# Patient Record
Sex: Female | Born: 1935 | Race: White | Hispanic: No | Smoking: Never smoker
Health system: Southern US, Community
[De-identification: ages and names within clinical notes are randomized; demographics above are authoritative.]

## PROBLEM LIST (undated history)

## (undated) DIAGNOSIS — K219 Gastro-esophageal reflux disease without esophagitis: Secondary | ICD-10-CM

## (undated) DIAGNOSIS — R600 Localized edema: Secondary | ICD-10-CM

## (undated) DIAGNOSIS — M1990 Unspecified osteoarthritis, unspecified site: Secondary | ICD-10-CM

## (undated) DIAGNOSIS — N3289 Other specified disorders of bladder: Secondary | ICD-10-CM

## (undated) DIAGNOSIS — I10 Essential (primary) hypertension: Secondary | ICD-10-CM

## (undated) DIAGNOSIS — R1013 Epigastric pain: Secondary | ICD-10-CM

## (undated) DIAGNOSIS — R609 Edema, unspecified: Secondary | ICD-10-CM

## (undated) DIAGNOSIS — K529 Noninfective gastroenteritis and colitis, unspecified: Secondary | ICD-10-CM

## (undated) DIAGNOSIS — J449 Chronic obstructive pulmonary disease, unspecified: Secondary | ICD-10-CM

## (undated) DIAGNOSIS — I739 Peripheral vascular disease, unspecified: Secondary | ICD-10-CM

## (undated) DIAGNOSIS — H3530 Unspecified macular degeneration: Secondary | ICD-10-CM

## (undated) DIAGNOSIS — K602 Anal fissure, unspecified: Secondary | ICD-10-CM

## (undated) HISTORY — PX: OTHER SURGICAL HISTORY: SHX169

## (undated) HISTORY — DX: Epigastric pain: R10.13

## (undated) HISTORY — DX: Essential (primary) hypertension: I10

## (undated) HISTORY — DX: Other specified disorders of bladder: N32.89

## (undated) HISTORY — DX: Gastro-esophageal reflux disease without esophagitis: K21.9

## (undated) HISTORY — DX: Noninfective gastroenteritis and colitis, unspecified: K52.9

## (undated) HISTORY — DX: Unspecified macular degeneration: H35.30

## (undated) HISTORY — DX: Unspecified osteoarthritis, unspecified site: M19.90

## (undated) HISTORY — DX: Chronic obstructive pulmonary disease, unspecified: J44.9

## (undated) HISTORY — DX: Anal fissure, unspecified: K60.2

## (undated) HISTORY — DX: Edema, unspecified: R60.9

## (undated) HISTORY — DX: Peripheral vascular disease, unspecified: I73.9

## (undated) HISTORY — DX: Localized edema: R60.0

## (undated) HISTORY — PX: APPENDECTOMY: SHX54

---

## 1999-07-16 ENCOUNTER — Observation Stay (HOSPITAL_COMMUNITY): Admission: RE | Admit: 1999-07-16 | Discharge: 1999-07-17 | Payer: Self-pay | Admitting: *Deleted

## 1999-07-16 ENCOUNTER — Encounter: Payer: Self-pay | Admitting: *Deleted

## 2000-06-30 ENCOUNTER — Ambulatory Visit (HOSPITAL_COMMUNITY): Admission: RE | Admit: 2000-06-30 | Discharge: 2000-07-01 | Payer: Self-pay | Admitting: *Deleted

## 2000-07-18 ENCOUNTER — Encounter: Payer: Self-pay | Admitting: Gastroenterology

## 2000-07-18 ENCOUNTER — Ambulatory Visit (HOSPITAL_COMMUNITY): Admission: RE | Admit: 2000-07-18 | Discharge: 2000-07-18 | Payer: Self-pay | Admitting: Gastroenterology

## 2000-07-18 ENCOUNTER — Encounter (INDEPENDENT_AMBULATORY_CARE_PROVIDER_SITE_OTHER): Payer: Self-pay | Admitting: Specialist

## 2000-08-11 ENCOUNTER — Ambulatory Visit (HOSPITAL_COMMUNITY): Admission: RE | Admit: 2000-08-11 | Discharge: 2000-08-11 | Payer: Self-pay | Admitting: Gastroenterology

## 2000-08-11 ENCOUNTER — Encounter: Payer: Self-pay | Admitting: Gastroenterology

## 2004-06-14 ENCOUNTER — Ambulatory Visit: Payer: Self-pay | Admitting: Internal Medicine

## 2004-06-15 ENCOUNTER — Ambulatory Visit: Payer: Self-pay | Admitting: Internal Medicine

## 2007-03-13 DIAGNOSIS — K5289 Other specified noninfective gastroenteritis and colitis: Secondary | ICD-10-CM

## 2007-03-13 DIAGNOSIS — R1013 Epigastric pain: Secondary | ICD-10-CM

## 2007-03-13 DIAGNOSIS — M1990 Unspecified osteoarthritis, unspecified site: Secondary | ICD-10-CM | POA: Insufficient documentation

## 2007-03-13 DIAGNOSIS — Z8719 Personal history of other diseases of the digestive system: Secondary | ICD-10-CM

## 2007-03-13 DIAGNOSIS — K3189 Other diseases of stomach and duodenum: Secondary | ICD-10-CM

## 2007-03-13 DIAGNOSIS — I739 Peripheral vascular disease, unspecified: Secondary | ICD-10-CM

## 2007-03-13 DIAGNOSIS — K219 Gastro-esophageal reflux disease without esophagitis: Secondary | ICD-10-CM | POA: Insufficient documentation

## 2007-03-13 DIAGNOSIS — I10 Essential (primary) hypertension: Secondary | ICD-10-CM | POA: Insufficient documentation

## 2007-03-13 DIAGNOSIS — J449 Chronic obstructive pulmonary disease, unspecified: Secondary | ICD-10-CM | POA: Insufficient documentation

## 2007-03-13 DIAGNOSIS — K2980 Duodenitis without bleeding: Secondary | ICD-10-CM | POA: Insufficient documentation

## 2007-03-13 DIAGNOSIS — J4489 Other specified chronic obstructive pulmonary disease: Secondary | ICD-10-CM | POA: Insufficient documentation

## 2007-03-13 DIAGNOSIS — K602 Anal fissure, unspecified: Secondary | ICD-10-CM

## 2007-03-14 ENCOUNTER — Encounter: Payer: Self-pay | Admitting: Internal Medicine

## 2007-03-14 ENCOUNTER — Ambulatory Visit: Payer: Self-pay | Admitting: Internal Medicine

## 2007-04-11 ENCOUNTER — Telehealth: Payer: Self-pay | Admitting: Internal Medicine

## 2007-04-12 LAB — CONVERTED CEMR LAB
BUN: 17 mg/dL (ref 6–23)
CO2: 34 meq/L — ABNORMAL HIGH (ref 19–32)
Calcium: 9.7 mg/dL (ref 8.4–10.5)
Chloride: 105 meq/L (ref 96–112)
Cholesterol: 179 mg/dL (ref 0–200)
Creatinine, Ser: 0.9 mg/dL (ref 0.4–1.2)
Direct LDL: 91 mg/dL
GFR calc Af Amer: 75 mL/min
GFR calc non Af Amer: 62 mL/min
Glucose, Bld: 114 mg/dL — ABNORMAL HIGH (ref 70–99)
HDL: 36.2 mg/dL — ABNORMAL LOW (ref >39.0)
Potassium: 4 meq/L (ref 3.5–5.1)
Sodium: 144 meq/L (ref 135–145)
Total CHOL/HDL Ratio: 4.9
Triglycerides: 337 mg/dL (ref 0–149)
VLDL: 67 mg/dL — ABNORMAL HIGH (ref 0–40)

## 2007-05-03 ENCOUNTER — Ambulatory Visit: Payer: Self-pay | Admitting: Internal Medicine

## 2007-05-03 DIAGNOSIS — N3289 Other specified disorders of bladder: Secondary | ICD-10-CM

## 2007-09-17 ENCOUNTER — Telehealth: Payer: Self-pay | Admitting: Internal Medicine

## 2008-02-14 ENCOUNTER — Ambulatory Visit: Payer: Self-pay | Admitting: Internal Medicine

## 2008-02-14 DIAGNOSIS — R609 Edema, unspecified: Secondary | ICD-10-CM | POA: Insufficient documentation

## 2008-10-06 ENCOUNTER — Telehealth: Payer: Self-pay | Admitting: Internal Medicine

## 2009-02-17 ENCOUNTER — Ambulatory Visit: Payer: Self-pay | Admitting: Internal Medicine

## 2009-02-17 DIAGNOSIS — H3530 Unspecified macular degeneration: Secondary | ICD-10-CM | POA: Insufficient documentation

## 2009-03-16 ENCOUNTER — Ambulatory Visit: Payer: Self-pay | Admitting: Cardiovascular Disease

## 2009-03-19 ENCOUNTER — Encounter: Payer: Self-pay | Admitting: Cardiovascular Disease

## 2009-03-19 ENCOUNTER — Ambulatory Visit: Payer: Self-pay

## 2009-04-22 ENCOUNTER — Ambulatory Visit: Payer: Self-pay | Admitting: Cardiovascular Disease

## 2009-05-08 ENCOUNTER — Telehealth: Payer: Self-pay | Admitting: Internal Medicine

## 2009-09-10 ENCOUNTER — Encounter (INDEPENDENT_AMBULATORY_CARE_PROVIDER_SITE_OTHER): Payer: Self-pay | Admitting: *Deleted

## 2009-11-06 ENCOUNTER — Encounter: Payer: Self-pay | Admitting: Cardiovascular Disease

## 2009-11-09 ENCOUNTER — Ambulatory Visit: Payer: Self-pay | Admitting: Cardiovascular Disease

## 2009-11-25 ENCOUNTER — Ambulatory Visit: Payer: Self-pay | Admitting: Internal Medicine

## 2009-11-25 ENCOUNTER — Observation Stay (HOSPITAL_COMMUNITY): Admission: AD | Admit: 2009-11-25 | Discharge: 2009-11-28 | Payer: Self-pay | Admitting: Internal Medicine

## 2009-11-26 ENCOUNTER — Ambulatory Visit: Payer: Self-pay | Admitting: Internal Medicine

## 2009-11-26 ENCOUNTER — Telehealth: Payer: Self-pay | Admitting: Internal Medicine

## 2009-12-01 ENCOUNTER — Telehealth: Payer: Self-pay | Admitting: Internal Medicine

## 2009-12-11 ENCOUNTER — Ambulatory Visit: Payer: Self-pay | Admitting: Internal Medicine

## 2010-02-16 ENCOUNTER — Ambulatory Visit: Payer: Self-pay | Admitting: Internal Medicine

## 2010-06-29 NOTE — Initial Assessments (Signed)
Summary: RECTAL BLEEDING AND UPPER ABD PAIN/NWS   Vital Signs:  Patient profile:   77 year old female Height:      60 inches O2 Sat:      96 % on Room air Temp:     97.5 degrees F oral Pulse rate:   49 / minute BP sitting:   126 / 60  (left arm) Cuff size:   regular  Vitals Entered By: Bill Salinas CMA (November 25, 2009 11:41 AM)  O2 Flow:  Room air CC: pt her with c/o blood in stools/ ab   Primary Care Provider:  Norins  CC:  pt her with c/o blood in stools/ ab.  History of Present Illness: Patient presents acutely for bloody stools. She reports that since last night she has had 12 bloody stools. When she cleans herself there is frank blood on the tissue and her son there is frank blood in the commode. She has also developed substernal chest pain. Today she fell in the parking lot due to leg weakness. She is now admitted for 24 hr eval for probable diverticular bleed and possible anemia related angina.  Current Medications (verified): 1)  Atenolol 50 Mg Tabs (Atenolol) .... Take 1 Tablet By Mouth Once A Day 2)  Furosemide 40 Mg Tabs (Furosemide) .... Take 1 Tablet By Mouth Once A Day 3)  Norvasc 10 Mg  Tabs (Amlodipine Besylate) .Marland Kitchen.. 1 Tab Once Daily 4)  Xanax 0.5 Mg  Tabs (Alprazolam) .Marland Kitchen.. 1 As Needed 5)  Tylenol Arthritis Pain 650 Mg  Tbcr (Acetaminophen) .... As Needed 6)  Oxybutynin Chloride 5 Mg  Tabs (Oxybutynin Chloride) .... Take 1 Tablet By Mouth Once A Day 7)  Aspirin Ec Low Dose 81 Mg  Tbec (Aspirin) .... Take 1 Tablet By Mouth Once A Day 8)  Calcium 600 1500 Mg Tabs (Calcium Carbonate) .... Take 1 Tablet By Mouth Once A Day 9)  Plavix 75 Mg Tabs (Clopidogrel Bisulfate) .... Take One Tablet By Mouth Daily  Allergies (verified): No Known Drug Allergies  Past History:  Past Medical History: Last updated: 03/04/2009 PERIPHERAL VASCULAR DISEASE (ICD-443.9) PERIPHERAL EDEMA (ICD-782.3) COPD (ICD-496) HYPERTENSION (ICD-401.9) MACULAR DEGENERATION, BILATERAL  (ICD-362.50) IRRITABLE BLADDER (ICD-596.8) Hx of RECTAL FISSURE (ICD-565.0) * MOLE EXCISION * CATARACT SURGERY * LAPAROTOMY FOR TREATMENT OF ADHESIONS * LAPAROTOMY FOR RUPTURED APPENDIX Hx of ENTERITIS (ICD-558.9) DUODENITIS (ICD-535.60) ESOPHAGITIS, HX OF (ICD-V12.79) DEGENERATIVE JOINT DISEASE (ICD-715.90) DYSPEPSIA (ICD-536.8) GERD (ICD-530.81)    Past Surgical History: Last updated: 03/13/2007 Hx of RECTAL FISSURE (ICD-565.0) * MOLE EXCISION * CATARACT SURGERY * LAPAROTOMY FOR TREATMENT OF ADHESIONS * LAPAROTOMY FOR RUPTURED APPENDIX  Family History: Last updated: 03/14/2007 osteoporsis macular degeneration  Social History: Last updated: 03/16/2009 lives with her son. I-ADLs Widow x 43 years. No tobacco, alcohol, drugs. She drinks daily coffee Pt is DNR  Review of Systems       The patient complains of vision loss, chest pain, hematochezia, muscle weakness, and abnormal bleeding.  The patient denies anorexia, fever, weight loss, weight gain, decreased hearing, syncope, dyspnea on exertion, peripheral edema, prolonged cough, headaches, hemoptysis, abdominal pain, melena, incontinence, and difficulty walking.         macular degeneration with 95% visual loss  Physical Exam  General:  Very elderly (89) white female in no acute distress Head:  normocephalic and atraumatic.   Eyes:  pupils equal and pupils round.  very slowly reactive to light. Changes in the iris left eye. Mouth:  edentulous. No buccal lesions.  Neck:  supple and no thyromegaly.   Chest Wall:  no deformities.   Breasts:  deferred Lungs:  normal respiratory effort, normal breath sounds, no crackles, and no wheezes.   Heart:  normal rate, regular rhythm, and no murmur.   Abdomen:  soft, non-tender, normal bowel sounds, no guarding, and no rigidity.   Msk:  no joint tenderness, no joint swelling, no joint warmth, and no redness over joints.   Pulses:  2+ radial pulse Neurologic:  alert & oriented  X3 and cranial nerves II-XII intact  execpt for visual loss.   Skin:  poor turgor, normal color, no rashes Cervical Nodes:  no anterior cervical adenopathy and no posterior cervical adenopathy.   Psych:  Oriented X3, memory intact for recent and remote, and normally interactive.     Impression & Recommendations:  Problem # 1:  HEMATOCHEZIA (ICD-578.1)  Patient reports 12 blood stools and she was weak to the point of collapse in our parking lot. Suspect diverticular bleed in the absence of abdominal tenderness. Suspect she is acutely anemic  Plan - telemetry admit          CBC, Type and hold - transfuse for Hgb <8          hold plavix  Orders: No Charge Patient Arrived (NCPA0) (NCPA0)  Problem # 2:  CHEST PAIN, ACUTE (ICD-786.50) Concern for anemia induced angina.  Plan - 12 lead EKG           cardiac panel q8 x 3  Problem # 3:  COPD (ICD-496) stable with no active SOB, normal respiratory rate.   Plan  continue home meds.   Problem # 4:  Code status Per patients wishes she is a DNR/DNI  Complete Medication List: 1)  Atenolol 50 Mg Tabs (Atenolol) .... Take 1 tablet by mouth once a day 2)  Furosemide 40 Mg Tabs (Furosemide) .... Take 1 tablet by mouth once a day 3)  Norvasc 10 Mg Tabs (Amlodipine besylate) .Marland Kitchen.. 1 tab once daily 4)  Xanax 0.5 Mg Tabs (Alprazolam) .Marland Kitchen.. 1 as needed 5)  Tylenol Arthritis Pain 650 Mg Tbcr (Acetaminophen) .... As needed 6)  Oxybutynin Chloride 5 Mg Tabs (Oxybutynin chloride) .... Take 1 tablet by mouth once a day 7)  Aspirin Ec Low Dose 81 Mg Tbec (Aspirin) .... Take 1 tablet by mouth once a day 8)  Calcium 600 1500 Mg Tabs (Calcium carbonate) .... Take 1 tablet by mouth once a day 9)  Plavix 75 Mg Tabs (Clopidogrel bisulfate) .... Take one tablet by mouth daily

## 2010-06-29 NOTE — Progress Notes (Signed)
Summary: Call Report/MEN pt  Phone Note Other Incoming   Caller: [facility] Call Report Summary of Call: [facility] Triage Call Report Triage Record Num: [id number] Operator: Shawna Orleans Chapoton Patient Name: [name] Call Date & Time: 11/28/2009 1:51:12PM Patient Phone: [phone] PCP: Illene Regulus Patient Gender: Female PCP Fax : [phone] Patient DOB: 10-09-1911 Practice Name: [facility] Reason for Call: Daughter calling re pt needing refill of Xanax. Dr. Dayton Martes contacted and order called in to [facility] pharmacy [phone] for Xanax .5mg  p.o. tid prn - 30 pills- no refills per M.D. order. Protocol(s) Used: Office Note Recommended Outcome per Protocol: Information Noted and Sent to Office Reason for Outcome: Caller information to office Care Advice:  ~ 07/ Initial call taken by: Margaret Pyle, CMA,  December 01, 2009 8:37 AM  Follow-up for Phone Call        noted Follow-up by: Tresa Garter MD,  December 01, 2009 1:22 PM

## 2010-06-29 NOTE — Letter (Signed)
Summary: Appointment - Reschedule  [facility], Main Office  [address]   [place], [place] [address]   Phone: [phone]  Fax: [phone]     September 10, 2009 MRN: [id number]   [name] [address] [place], [place]  [address]   Dear [name],   Due to a change in our office schedule, your appointment on 11/09/2009 at 11:30am has been changed.  Then new appointment is set up for 11/09/2009 at 3:00pm. We look forward to participating in your health care needs. Please contact us at the number listed above at your earliest convenience to reschedule this appointment.     Sincerely,  [facility] Scheduling Team  Appended Document: Appointment - Reschedule wrong letter

## 2010-06-29 NOTE — Letter (Signed)
Summary: Appointment - Reschedule  [facility], Main Office  [address]   [place], [place] [address]   Phone: [phone]  Fax: [phone]     September 10, 2009 MRN: [id number]   [name] [address] [place], [place]  [address]   Dear Ms. Kulinski,   Due to a change in our office schedule, your appointment on 11/09/2009 at  11:30am has been changed.  The new appointment is 11/09/2009 at 3:00pm. We look forward to participating in your health care needs. Please contact us at the number listed above if you are unable to make this appointment    Sincerely,  [facility] Scheduling Team

## 2010-06-29 NOTE — Progress Notes (Signed)
Summary: REQ FOR A CALL FROM MD  Phone Note Call from Patient   Caller: GrandDaughter Elroy Channel [phone] Summary of Call: Pt's granddaughter called. Pt was recently put on plavix from cardiology. Runell Gess would like a call from Dr Debby Bud if possible to get update regarding pt's care.  Initial call taken by: Lamar Sprinkles, CMA,  November 26, 2009 4:06 PM

## 2010-06-29 NOTE — Assessment & Plan Note (Signed)
Summary: freq falls/bruised arm,change meds/cd   Vital Signs:  Patient profile:   77 year old female Height:      60 inches Weight:      136 pounds BMI:     26.66 O2 Sat:      98 % on Room air Temp:     97.0 degrees F oral Pulse rate:   69 / minute BP sitting:   162 / 56  (left arm) Cuff size:   regular  Vitals Entered By: Bill Salinas CMA (February 16, 2010 3:30 PM)  O2 Flow:  Room air CC: pt fell about 3 weeks ago with bruising on her right arm. She denies hitting her head and states she fell on concrete walkway landing on her arm/ ab   Primary Care Provider:  Norins  CC:  pt fell about 3 weeks ago with bruising on her right arm. She denies hitting her head and states she fell on concrete walkway landing on her arm/ ab.  History of Present Illness: Patient returns for follow-up. In the interval she is having more falls- fell 3 weeks ago and bruised her left arm.  She is taking all her medications - reviewed the whole list. She does take cilostazol two times a day appropriate. BP is running a little high at 162 on 3 drug regimen.   Current Medications (verified): 1)  Furosemide 40 Mg Tabs (Furosemide) .... Take 1 Tablet By Mouth Once A Day 2)  Norvasc 10 Mg  Tabs (Amlodipine Besylate) .Marland Kitchen.. 1 Tab Once Daily 3)  Xanax 0.5 Mg  Tabs (Alprazolam) .Marland Kitchen.. 1 As Needed 4)  Tylenol Arthritis Pain 650 Mg  Tbcr (Acetaminophen) .... As Needed 5)  Oxybutynin Chloride 5 Mg  Tabs (Oxybutynin Chloride) .... Take 1 Tablet By Mouth Once A Day 6)  Aspirin Ec Low Dose 81 Mg  Tbec (Aspirin) .... Take 1 Tablet By Mouth Once A Day 7)  Calcium 600 1500 Mg Tabs (Calcium Carbonate) .... Take 1 Tablet By Mouth Once A Day 8)  Cilostazol 100 Mg Tabs (Cilostazol) .Marland Kitchen.. 1 Tab Once Daily 9)  Bystolic 2.5 Mg Tabs (Nebivolol Hcl) .Marland Kitchen.. 1 By Mouth Once Daily  Allergies (verified): No Known Drug Allergies  Past History:  Past Medical History: Last updated: 03/04/2009 PERIPHERAL VASCULAR DISEASE  (ICD-443.9) PERIPHERAL EDEMA (ICD-782.3) COPD (ICD-496) HYPERTENSION (ICD-401.9) MACULAR DEGENERATION, BILATERAL (ICD-362.50) IRRITABLE BLADDER (ICD-596.8) Hx of RECTAL FISSURE (ICD-565.0) * MOLE EXCISION * CATARACT SURGERY * LAPAROTOMY FOR TREATMENT OF ADHESIONS * LAPAROTOMY FOR RUPTURED APPENDIX Hx of ENTERITIS (ICD-558.9) DUODENITIS (ICD-535.60) ESOPHAGITIS, HX OF (ICD-V12.79) DEGENERATIVE JOINT DISEASE (ICD-715.90) DYSPEPSIA (ICD-536.8) GERD (ICD-530.81)    Past Surgical History: Last updated: 03/13/2007 Hx of RECTAL FISSURE (ICD-565.0) * MOLE EXCISION * CATARACT SURGERY * LAPAROTOMY FOR TREATMENT OF ADHESIONS * LAPAROTOMY FOR RUPTURED APPENDIX  Family History: Last updated: 03/14/2007 osteoporsis macular degeneration  Social History: Last updated: 03/16/2009 lives with her son. I-ADLs Widow x 43 years. No tobacco, alcohol, drugs. She drinks daily coffee Pt is DNR  Review of Systems  The patient denies anorexia, fever, weight loss, weight gain, vision loss, chest pain, dyspnea on exertion, prolonged cough, headaches, hemoptysis, abdominal pain, hematochezia, incontinence, muscle weakness, difficulty walking, abnormal bleeding, enlarged lymph nodes, and angioedema.    Physical Exam  General:  alert, well-developed, well-nourished, and well-hydrated.   Head:  normocephalic and atraumatic.   Eyes:  C7S Neck:  supple and full ROM.   Lungs:  normal respiratory effort, normal breath sounds, no crackles, and no wheezes.  Heart:  normal rate and regular rhythm.   Abdomen:  soft, non-tender, and normal bowel sounds.   Msk:  normal ROM, no joint tenderness, no joint swelling, and no redness over joints.   Pulses:  2+ radial  Neurologic:  alert & oriented X3, cranial nerves II-XII intact, strength normal in all extremities, sensation intact to light touch, sensation intact to pinprick, gait normal, and DTRs symmetrical and normal.   Skin:  poor turgor, bruise right  proximal UE. Cervical Nodes:  no anterior cervical adenopathy and no posterior cervical adenopathy.   Psych:  Oriented X3 and memory intact for recent and remote.     Impression & Recommendations:  Problem # 1:  PERIPHERAL VASCULAR DISEASE (ICD-443.9) stable on rhealogic agent. She is very mobile and does not report claudication symptoms  Problem # 2:  HYPERTENSION (ICD-401.9)  Her updated medication list for this problem includes:    Furosemide 40 Mg Tabs (Furosemide) .Marland Kitchen... Take 1 tablet by mouth once a day    Norvasc 10 Mg Tabs (Amlodipine besylate) .Marland Kitchen... 1 tab once daily    Bystolic 2.5 Mg Tabs (Nebivolol hcl) .Marland Kitchen... 1 by mouth once daily  BP today: 162/56 Prior BP: 160/64 (12/11/2009)  Subopitmal control. Patient to have her BP checked by [facility] in-home aid. Will adjust meds based on this information when received.   Problem # 3:  ACCIDENTAL FALLS, RECURRENT (ICD-E888.9) multiple risk factors including poor vision, arthritis, PVD. She is only using a "walking stick."   Plan - sheis advised to use either a quad cane or a rolling walker to prevent falls. Her niece is present and reenforces this recommendation.   Problem # 4:  PERIPHERAL EDEMA (ICD-782.3) Persistent peripheral edema. Patient is unable to put on support hose, even OTC strength stockings. She does not have anyone to help her with this. She does not have any skin breakdown or stasis ulcers.   Plan - try to wear knee high stockings           elevate legs during the day  Her updated medication list for this problem includes:    Furosemide 40 Mg Tabs (Furosemide) .Marland Kitchen... Take 1 tablet by mouth once a day  Complete Medication List: 1)  Furosemide 40 Mg Tabs (Furosemide) .... Take 1 tablet by mouth once a day 2)  Norvasc 10 Mg Tabs (Amlodipine besylate) .Marland Kitchen.. 1 tab once daily 3)  Xanax 0.5 Mg Tabs (Alprazolam) .Marland Kitchen.. 1 as needed 4)  Tylenol Arthritis Pain 650 Mg Tbcr (Acetaminophen) .... As needed 5)   Oxybutynin Chloride 5 Mg Tabs (Oxybutynin chloride) .... Take 1 tablet by mouth once a day 6)  Aspirin Ec Low Dose 81 Mg Tbec (Aspirin) .... Take 1 tablet by mouth once a day 7)  Calcium 600 1500 Mg Tabs (Calcium carbonate) .... Take 1 tablet by mouth once a day 8)  Cilostazol 100 Mg Tabs (Cilostazol) .Marland Kitchen.. 1 tab once daily 9)  Bystolic 2.5 Mg Tabs (Nebivolol hcl) .Marland Kitchen.. 1 by mouth once daily

## 2010-06-29 NOTE — Assessment & Plan Note (Signed)
Summary: [facility]   Vital Signs:  Patient profile:   77 year old female Height:      60 inches Weight:      134 pounds BMI:     26.26 O2 Sat:      94 % on Room air Temp:     98.1 degrees F oral Pulse rate:   75 / minute BP sitting:   160 / 64  (left arm) Cuff size:   regular  Vitals Entered By: Bill Salinas CMA (December 11, 2009 4:08 PM)  O2 Flow:  Room air CC: hosp f/u/ ab   Primary Care Provider:  Pace Lamadrid  CC:  hosp f/u/ ab.  History of Present Illness: Patient was hospitalized June 29-July2 for hematochezia thougth to be a diverticular bleed. She did require transfusion due to drop in Hgb. At d/c Hgb was 11.7. Since d/c she has done well with no further bleeding. she is feeling well. Good appetite, normal bowel movements. BP has been stable. She is active but has been a little slack lately.   Current Medications (verified): 1)  Atenolol 50 Mg Tabs (Atenolol) .... Take 1 Tablet By Mouth Once A Day 2)  Furosemide 40 Mg Tabs (Furosemide) .... Take 1 Tablet By Mouth Once A Day 3)  Norvasc 10 Mg  Tabs (Amlodipine Besylate) .Marland Kitchen.. 1 Tab Once Daily 4)  Xanax 0.5 Mg  Tabs (Alprazolam) .Marland Kitchen.. 1 As Needed 5)  Tylenol Arthritis Pain 650 Mg  Tbcr (Acetaminophen) .... As Needed 6)  Oxybutynin Chloride 5 Mg  Tabs (Oxybutynin Chloride) .... Take 1 Tablet By Mouth Once A Day 7)  Aspirin Ec Low Dose 81 Mg  Tbec (Aspirin) .... Take 1 Tablet By Mouth Once A Day 8)  Calcium 600 1500 Mg Tabs (Calcium Carbonate) .... Take 1 Tablet By Mouth Once A Day 9)  Plavix 75 Mg Tabs (Clopidogrel Bisulfate) .... Take One Tablet By Mouth Daily 10)  Cilostazol 100 Mg Tabs (Cilostazol) .Marland Kitchen.. 1 Tab Once Daily  Allergies (verified): No Known Drug Allergies  Past History:  Past Medical History: Last updated: 03/04/2009 PERIPHERAL VASCULAR DISEASE (ICD-443.9) PERIPHERAL EDEMA (ICD-782.3) COPD (ICD-496) HYPERTENSION (ICD-401.9) MACULAR DEGENERATION, BILATERAL (ICD-362.50) IRRITABLE BLADDER  (ICD-596.8) Hx of RECTAL FISSURE (ICD-565.0) * MOLE EXCISION * CATARACT SURGERY * LAPAROTOMY FOR TREATMENT OF ADHESIONS * LAPAROTOMY FOR RUPTURED APPENDIX Hx of ENTERITIS (ICD-558.9) DUODENITIS (ICD-535.60) ESOPHAGITIS, HX OF (ICD-V12.79) DEGENERATIVE JOINT DISEASE (ICD-715.90) DYSPEPSIA (ICD-536.8) GERD (ICD-530.81)    Past Surgical History: Last updated: 03/13/2007 Hx of RECTAL FISSURE (ICD-565.0) * MOLE EXCISION * CATARACT SURGERY * LAPAROTOMY FOR TREATMENT OF ADHESIONS * LAPAROTOMY FOR RUPTURED APPENDIX  Family History: Last updated: 03/14/2007 osteoporsis macular degeneration  Social History: Last updated: 03/16/2009 lives with her son. I-ADLs Widow x 43 years. No tobacco, alcohol, drugs. She drinks daily coffee Pt is DNR  Risk Factors: Exercise: no (02/14/2008)  Risk Factors: Smoking Status: never (03/13/2007) PMH-FH-SH reviewed-no changes except otherwise noted  Review of Systems  The patient denies anorexia, fever, weight loss, weight gain, decreased hearing, chest pain, dyspnea on exertion, peripheral edema, hemoptysis, abdominal pain, muscle weakness, depression, and abnormal bleeding.    Physical Exam  General:  WNWD white female looking considerably younger than her stated age of 76 Head:  Normocephalic and atraumatic without obvious abnormalities. No apparent alopecia or balding. Eyes:  C&S clear. Functionally blind Neck:  supple.   Lungs:  normal respiratory effort and normal breath sounds.   Heart:  normal rate and regular rhythm.  Abdomen:  soft, non-tender, and normal bowel sounds.   Neurologic:  alert & oriented X3 and gait normal.   Skin:  turgor normal, color normal, and no rashes.   Psych:  Oriented X3, normally interactive, and not anxious appearing.     Impression & Recommendations:  Problem # 1:  HEMATOCHEZIA (ICD-578.1) Resolved, most likely a diverticular bleed.   Complete Medication List: 1)  Furosemide 40 Mg Tabs  (Furosemide) .... Take 1 tablet by mouth once a day 2)  Norvasc 10 Mg Tabs (Amlodipine besylate) .Marland Kitchen.. 1 tab once daily 3)  Xanax 0.5 Mg Tabs (Alprazolam) .Marland Kitchen.. 1 as needed 4)  Tylenol Arthritis Pain 650 Mg Tbcr (Acetaminophen) .... As needed 5)  Oxybutynin Chloride 5 Mg Tabs (Oxybutynin chloride) .... Take 1 tablet by mouth once a day 6)  Aspirin Ec Low Dose 81 Mg Tbec (Aspirin) .... Take 1 tablet by mouth once a day 7)  Calcium 600 1500 Mg Tabs (Calcium carbonate) .... Take 1 tablet by mouth once a day 8)  Cilostazol 100 Mg Tabs (Cilostazol) .Marland Kitchen.. 1 tab once daily 9)  Bystolic 2.5 Mg Tabs (Nebivolol hcl) .Marland Kitchen.. 1 by mouth once daily Prescriptions: XANAX 0.5 MG  TABS (ALPRAZOLAM) 1 as needed  #30 x 5   Entered and Authorized by:   Jacques Navy MD   Signed by:   Jacques Navy MD on 12/11/2009   Method used:   Handwritten   RxID:   [id number] CILOSTAZOL 100 MG TABS (CILOSTAZOL) 1 tab once daily  #60 x 12   Entered and Authorized by:   Jacques Navy MD   Signed by:   Jacques Navy MD on 12/11/2009   Method used:   Electronically to        [facility]  [address] (retail)       [address]       [place], [place]  [address]       Ph: [phone]       Fax: [phone]   RxID:   [id number] OXYBUTYNIN CHLORIDE 5 MG  TABS (OXYBUTYNIN CHLORIDE) Take 1 tablet by mouth once a day  #30 x 12   Entered and Authorized by:   Jacques Navy MD   Signed by:   Jacques Navy MD on 12/11/2009   Method used:   Electronically to        [facility]  [address] (retail)       [address]       [place], [place]  [address]       Ph: [phone]       Fax: [phone]   RxID:   [id number] NORVASC 10 MG  TABS (AMLODIPINE BESYLATE) 1 tab once daily  #30 x 12   Entered and Authorized by:   Jacques Navy MD   Signed by:   Jacques Navy MD on 12/11/2009   Method used:   Electronically to        [facility]  [address] (retail)       [address]       [place], [place]  [address]       Ph: [phone]       Fax: [phone]   RxID:   [id number] FUROSEMIDE 40 MG TABS (FUROSEMIDE) Take 1 tablet by mouth once a day  #  30 x 12   Entered and Authorized by:   Jacques Navy MD   Signed by:   Jacques Navy MD on 12/11/2009   Method used:   Electronically to        [facility]  [address] (retail)       [address]       [place], [place]  [address]       Ph: [phone]       Fax: [phone]   RxID:   [id number] BYSTOLIC 2.5 MG TABS (NEBIVOLOL HCL) 1 by mouth once daily  #30 x 12   Entered and Authorized by:   Jacques Navy MD   Signed by:   Jacques Navy MD on 12/11/2009   Method used:   Electronically to        [facility]  [address] (retail)       [address]       [place], [place]  [address]       Ph: [phone]       Fax: [phone]   RxID:   [id number]

## 2010-06-29 NOTE — Assessment & Plan Note (Signed)
Summary: 6 month follow up/PVD/pla   Visit Type:  6 mo f/u Primary Provider:  Norins  CC:  no sob...edema/ankles/feet/legs....  History of Present Illness: 77 yo WF with history of HTN, macular degeneration, COPD and PVD with chronic mild lower extremity edema here for PV follow up. She has been known for some time to have at least moderate lower extremity arterial disease with ABI less than 0.5 bilaterally. She denies claudication or rest pain but does have numbness in legs with walking to the mailbox. She does not have numbness in her legs when she is sitting around the house. She has had several falls because her legs get weak and "go out."  She has been on ASA and Pletal. She recently lost her balance while carrying garbage down the stairs with her son and fell. No fractures She continues to have daytime lower ext edema. No CP, SOB. No changes.  Current Medications (verified): 1)  Atenolol 50 Mg Tabs (Atenolol) .... Take 1 Tablet By Mouth Once A Day 2)  Furosemide 40 Mg Tabs (Furosemide) .... Take 1 Tablet By Mouth Once A Day 3)  Norvasc 10 Mg  Tabs (Amlodipine Besylate) .Marland Kitchen.. 1 Tab Once Daily 4)  Xanax 0.5 Mg  Tabs (Alprazolam) .Marland Kitchen.. 1 As Needed 5)  Tylenol Arthritis Pain 650 Mg  Tbcr (Acetaminophen) .... As Needed 6)  Oxybutynin Chloride 5 Mg  Tabs (Oxybutynin Chloride) .... Take 1 Tablet By Mouth Once A Day 7)  Aspirin Ec Low Dose 81 Mg  Tbec (Aspirin) .... Take 1 Tablet By Mouth Once A Day 8)  Calcium 600 1500 Mg Tabs (Calcium Carbonate) .... Take 1 Tablet By Mouth Once A Day 9)  Cilostazol 100 Mg Tabs (Cilostazol) .Marland Kitchen.. 1 By Mouth Two Times A Day For Circulation  Allergies (verified): No Known Drug Allergies  Past History:  Past Medical History: Reviewed history from 03/04/2009 and no changes required. PERIPHERAL VASCULAR DISEASE (ICD-443.9) PERIPHERAL EDEMA (ICD-782.3) COPD (ICD-496) HYPERTENSION (ICD-401.9) MACULAR DEGENERATION, BILATERAL (ICD-362.50) IRRITABLE BLADDER  (ICD-596.8) Hx of RECTAL FISSURE (ICD-565.0) * MOLE EXCISION * CATARACT SURGERY * LAPAROTOMY FOR TREATMENT OF ADHESIONS * LAPAROTOMY FOR RUPTURED APPENDIX Hx of ENTERITIS (ICD-558.9) DUODENITIS (ICD-535.60) ESOPHAGITIS, HX OF (ICD-V12.79) DEGENERATIVE JOINT DISEASE (ICD-715.90) DYSPEPSIA (ICD-536.8) GERD (ICD-530.81)    Social History: Reviewed history from 03/16/2009 and no changes required. lives with her son. I-ADLs Widow x 43 years. No tobacco, alcohol, drugs. She drinks daily coffee Pt is DNR  Review of Systems       The patient complains of leg swelling.  The patient denies fatigue, malaise, fever, weight gain/loss, vision loss, decreased hearing, hoarseness, chest pain, palpitations, shortness of breath, prolonged cough, wheezing, sleep apnea, coughing up blood, abdominal pain, blood in stool, nausea, vomiting, diarrhea, heartburn, incontinence, blood in urine, muscle weakness, joint pain, rash, skin lesions, headache, fainting, dizziness, depression, anxiety, enlarged lymph nodes, easy bruising or bleeding, and environmental allergies.         See HPI  Vital Signs:  Patient profile:   77 year old female Height:      60 inches Weight:      134 pounds BMI:     26.26 Pulse rate:   62 / minute Pulse rhythm:   regular BP sitting:   152 / 70  (left arm) Cuff size:   regular  Vitals Entered By: Danielle Rankin, CMA (November 09, 2009 3:42 PM)  Physical Exam  General:  General: Well developed, well nourished, NAD Musculoskeletal: Muscle strength 5/5 all ext Psychiatric:  Mood and affect normal Neck: No JVD, no carotid bruits, no thyromegaly, no lymphadenopathy. Lungs:Clear bilaterally, no wheezes, rhonci, crackles CV: RRR no murmurs, gallops rubs Abdomen: soft, NT, ND, BS present Extremities: No bilateral lower extremity edema, pulses trace to 1+ bilateral DP/PT.     ABI's  Procedure date:  11/09/2009  Findings:      ABI 0.53 on right, 0.50 on left. Bilateral ATA  and PTA waveforms are monophasic with good amplitude.  Impression & Recommendations:  Problem # 1:  PERIPHERAL VASCULAR DISEASE (ICD-443.9) Stable. Will d/c Pletal and will start Plavix 75 mg by mouth once daily. Continue ASA. Will have her wear compression stockings for her edema. Elevate legs during the day. Follow up ABI 6 months. I will see her in 6 months.   Patient Instructions: 1)  Your physician recommends that you schedule a follow-up appointment in: 6 months 2)  Your physician has recommended you make the following change in your medication: Stop cilostazol. 3)  Start Plavix 75 mg by mouth daily 4)  Your physician has requested that you have an ankle brachial index (ABI). During this test an ultrasound and blood pressure cuff are used to evaluate the arteries that supply the arms and legs with blood. Allow thirty minutes for this exam. There are no restrictions or special instructions. To be done in 6 months. 5)  You have prescription for compression stockings. Prescriptions: PLAVIX 75 MG TABS (CLOPIDOGREL BISULFATE) Take one tablet by mouth daily  #30 x 11   Entered by:   Dossie Arbour, RN, BSN   Authorized by:   Verne Carrow, MD   Signed by:   Dossie Arbour, RN, BSN on 11/09/2009   Method used:   Electronically to        [facility] (retail)       [address]       [place], [place]  [address]       Ph: [phone]       Fax: [phone]   RxID:   [id number]

## 2010-06-29 NOTE — Miscellaneous (Signed)
Summary: Orders Update  Clinical Lists Changes  Orders: Added new Test order of Arterial Duplex Lower Extremity (Arterial Duplex Low) - Signed 

## 2010-07-02 ENCOUNTER — Telehealth: Payer: Self-pay | Admitting: Internal Medicine

## 2010-07-02 ENCOUNTER — Encounter: Payer: Self-pay | Admitting: Cardiovascular Disease

## 2010-07-05 ENCOUNTER — Encounter: Payer: Self-pay | Admitting: Cardiovascular Disease

## 2010-07-05 ENCOUNTER — Ambulatory Visit (INDEPENDENT_AMBULATORY_CARE_PROVIDER_SITE_OTHER): Payer: Medicare Other | Admitting: Cardiovascular Disease

## 2010-07-05 ENCOUNTER — Encounter (INDEPENDENT_AMBULATORY_CARE_PROVIDER_SITE_OTHER): Payer: Medicare Other

## 2010-07-05 DIAGNOSIS — I739 Peripheral vascular disease, unspecified: Secondary | ICD-10-CM

## 2010-07-07 NOTE — Miscellaneous (Signed)
Summary: Orders Update  Clinical Lists Changes  Orders: Added new Test order of Arterial Duplex Lower Extremity (Arterial Duplex Low) - Signed 

## 2010-07-07 NOTE — Progress Notes (Signed)
  Phone Note Refill Request Message from:  Fax from Pharmacy on July 02, 2010 10:45 AM  Refills Requested: Medication #1:  XANAX 0.5 MG  TABS 1 as needed   Last Refilled: 05/25/2010 Please Advise refills  Initial call taken by: Ami Bullins CMA,  July 02, 2010 10:45 AM  Follow-up for Phone Call        ok for refill x 5 Follow-up by: Jacques Navy MD,  July 02, 2010 12:50 PM    Prescriptions: [name] 0.5 MG  TABS (ALPRAZOLAM) 1 as needed  #30 x 5   Entered by:   Rock Nephew CMA   Authorized by:   Jacques Navy MD   Signed by:   Rock Nephew CMA on 07/02/2010   Method used:   Telephoned to ...       [facility] (retail)       [address]       [place], [place]  [address]       Ph: [phone]       Fax: [phone]   RxID:   [id number]

## 2010-07-15 NOTE — Assessment & Plan Note (Signed)
Summary: 6 month follow up / 443.9/ rm   Visit Type:  Follow-up 6 months Primary Provider:  Norins  CC:  pt has no complaints today.  History of Present Illness: 77 yo WF with history of HTN, macular degeneration, COPD and PVD with chronic mild lower extremity edema here for PV follow up. She has been known for some time to have at least moderate lower extremity arterial disease with ABI less than 0.5 bilaterally. She denies claudication or rest pain but does have numbness in legs with walking.  She continues to have daytime lower ext edema. No CP, SOB. Since last visit, she was admitted to [facility] for hematchezia felt to be secondary to be secondary to a diverticular bleed. Her Plavix was stopped in the hospital. She has had no recurrent bleeding since discharge.   ABI today 0.49 on the right and 0.51 on the left today.   Current Medications (verified): 1)  Furosemide 40 Mg Tabs (Furosemide) .... Take 1 Tablet By Mouth Once A Day 2)  Norvasc 10 Mg  Tabs (Amlodipine Besylate) .Marland Kitchen.. 1 Tab Once Daily 3)  Xanax 0.5 Mg  Tabs (Alprazolam) .Marland Kitchen.. 1 As Needed 4)  Tylenol Arthritis Pain 650 Mg  Tbcr (Acetaminophen) .... As Needed 5)  Oxybutynin Chloride 5 Mg  Tabs (Oxybutynin Chloride) .... Take 1 Tablet By Mouth Once A Day 6)  Aspirin Ec Low Dose 81 Mg  Tbec (Aspirin) .... Take 1 Tablet By Mouth Once A Day 7)  Calcium 600 1500 Mg Tabs (Calcium Carbonate) .... Take 1 Tablet By Mouth Twice A Day 8)  Cilostazol 100 Mg Tabs (Cilostazol) .Marland Kitchen.. 1 Tab Twice Daily 9)  Bystolic 2.5 Mg Tabs (Nebivolol Hcl) .Marland Kitchen.. 1 By Mouth Once Daily 10)  Systane 0.4-0.3 % Soln (Polyethyl Glycol-Propyl Glycol) .... 2 Drops Each Eye At Bedtime  Allergies (verified): No Known Drug Allergies  Past History:  Past Medical History: Reviewed history from 03/04/2009 and no changes required. PERIPHERAL VASCULAR DISEASE (ICD-443.9) PERIPHERAL EDEMA (ICD-782.3) COPD (ICD-496) HYPERTENSION (ICD-401.9) MACULAR DEGENERATION,  BILATERAL (ICD-362.50) IRRITABLE BLADDER (ICD-596.8) Hx of RECTAL FISSURE (ICD-565.0) * MOLE EXCISION * CATARACT SURGERY * LAPAROTOMY FOR TREATMENT OF ADHESIONS * LAPAROTOMY FOR RUPTURED APPENDIX Hx of ENTERITIS (ICD-558.9) DUODENITIS (ICD-535.60) ESOPHAGITIS, HX OF (ICD-V12.79) DEGENERATIVE JOINT DISEASE (ICD-715.90) DYSPEPSIA (ICD-536.8) GERD (ICD-530.81)    Social History: Reviewed history from 03/16/2009 and no changes required. lives with her son. I-ADLs Widow x 43 years. No tobacco, alcohol, drugs. She drinks daily coffee Pt is DNR  Review of Systems  The patient denies fatigue, malaise, fever, weight gain/loss, vision loss, decreased hearing, hoarseness, chest pain, palpitations, shortness of breath, prolonged cough, wheezing, sleep apnea, coughing up blood, abdominal pain, blood in stool, nausea, vomiting, diarrhea, heartburn, incontinence, blood in urine, muscle weakness, joint pain, leg swelling, rash, skin lesions, headache, fainting, dizziness, depression, anxiety, enlarged lymph nodes, easy bruising or bleeding, and environmental allergies.    Vital Signs:  Patient profile:   77 year old female Height:      60 inches Weight:      175.56 pounds BMI:     34.41 Pulse rate:   80 / minute Resp:     18 per minute BP sitting:   160 / 60  (left arm)  Vitals Entered By: Celestia Khat, CMA (July 05, 2010 3:27 PM)  Physical Exam  General:  General: Well developed, well nourished, NAD Musculoskeletal: Muscle strength 5/5 all ext Psychiatric: Mood and affect normal Neck: No JVD, no carotid bruits,  no thyromegaly, no lymphadenopathy. Lungs:Clear bilaterally, no wheezes, rhonci, crackles CV: RRR with slight systolic  murmur. No  gallops rubs Abdomen: soft, NT, ND, BS present Extremities: No bilateral lower extremity edema, pulses trace to 1+ bilateral DP/PT.     ABI's  Procedure date:  07/05/2010  Findings:      Right ABI  0.49, Left ABI 0.52.    Impression & Recommendations:  Problem # 1:  PERIPHERAL VASCULAR DISEASE (ICD-443.9) Stable. No changes. Her Plavix was stopped in the hospital. Continue ASA. I will plan on seeing her back in one year. I will stop non-invasive testing given her age and overall stability of disease for the last few years. She will call if she has any change in her clinical status.   Patient Instructions: 1)  Your physician recommends that you schedule a follow-up appointment in: 1 year 2)  Your physician recommends that you continue on your current medications as directed. Please refer to the Current Medication list given to you today.

## 2010-08-15 LAB — HEMOGLOBIN AND HEMATOCRIT, BLOOD
HCT: 34 % — ABNORMAL LOW (ref 36.0–46.0)
Hemoglobin: 11.7 g/dL — ABNORMAL LOW (ref 12.0–15.0)
Hemoglobin: 12 g/dL (ref 12.0–15.0)

## 2010-08-16 LAB — CROSSMATCH
ABO/RH(D): AB POS
Antibody Screen: NEGATIVE

## 2010-08-16 LAB — COMPREHENSIVE METABOLIC PANEL
Albumin: 3.7 g/dL (ref 3.5–5.2)
Alkaline Phosphatase: 68 U/L (ref 39–117)
BUN: 28 mg/dL — ABNORMAL HIGH (ref 6–23)
CO2: 27 mEq/L (ref 19–32)
Chloride: 105 mEq/L (ref 96–112)
GFR calc non Af Amer: 41 mL/min — ABNORMAL LOW (ref >60)
Potassium: 4 mEq/L (ref 3.5–5.1)
Total Bilirubin: 1.1 mg/dL (ref 0.3–1.2)

## 2010-08-16 LAB — CBC
HCT: 38.4 % (ref 36.0–46.0)
Hemoglobin: 13.3 g/dL (ref 12.0–15.0)
MCH: 32 pg (ref 26.0–34.0)
MCV: 92.7 fL (ref 78.0–100.0)
Platelets: 139 10*3/uL — ABNORMAL LOW (ref 150–400)
RBC: 4.14 MIL/uL (ref 3.87–5.11)
WBC: 9.1 10*3/uL (ref 4.0–10.5)

## 2010-08-16 LAB — CARDIAC PANEL(CRET KIN+CKTOT+MB+TROPI): CK, MB: 0.6 ng/mL (ref 0.3–4.0)

## 2010-08-16 LAB — HEMOGLOBIN AND HEMATOCRIT, BLOOD
HCT: 28.2 % — ABNORMAL LOW (ref 36.0–46.0)
HCT: 33.3 % — ABNORMAL LOW (ref 36.0–46.0)

## 2010-08-16 LAB — GLUCOSE, CAPILLARY: Glucose-Capillary: 123 mg/dL — ABNORMAL HIGH (ref 70–99)

## 2010-10-15 NOTE — Discharge Summary (Signed)
[place]. [facility]  Patient:    [name], HANNOLD                         MRN: [id number] Adm. Date:  [id number] Disc. Date: 07/01/00 Attending:  Veneda Melter Dictator:   Delton See, P.A. CC:         Rosalyn Gess. Norins, M.D. [facility]   Discharge Summary  HISTORY OF PRESENT ILLNESS:  Mr. [name] is an 77 year old female with a history of hypertension and peripheral vascular disease who was seen in the office on June 06, 2000, by Dr. Chales Abrahams.  The patient had undergone a PTA of the right superficial femoral artery on July 16, 1999, for significant right lower extremity claudication.  Since that time she had developed progression of the symptoms in her left lower extremity.  The patient had known high grade narrowing of the left superficial femoral artery on ankle brachial indices from August of 1999 to June of 2001.  The left index was reduced from 0.96 to 0.48 while the right index increased from 0.56 to 0.64 following the intervention to the right superficial femoral artery.  The patient now has significant limitation of mobility and requested consideration for left lower extremity intervention.  The patient noted profound weakness in general as well as in her legs.  She denied chest pain, shortness of breath, or syncope.  Arrangements were made to admit her to [facility] on May 30, 2000, for further intervention for peripheral vascular disease.  MEDICATIONS: 1. Lasix 40 mg daily. 2. Claritin 10 mg daily. 3. Analopril 10 mg daily. 4. Aspirin 325 mg daily. 5. Atenolol 25 mg daily.  HOSPITAL COURSE:  As noted, this patient was admitted to [facility] on June 30, 2000, for further treatment of peripheral vascular disease.  On the day of admission, the patient underwent PTA stenting of the left superficial femoral artery with significant improvement in her flow. Arrangements were made to discharge her the following  day in improved condition.  She was to remain on Plavix for at least one month.  She was seen by Dr. Chales Abrahams prior to discharge.  She is to follow up with him in approximately four weeks.  LABORATORY DATA:  A CBC performed June 23, 2000, revealed hemoglobin 14, hematocrit 40.9, WBC 5.1 thousand, platelets 185,000.  A PTT was 22.3, and INR was 1.0.  Chemistries revealed BUN 19, creatinine 1.0, potassium 5.0.  A chest x-ray showed COPD, but no evidence for acute disease.  An EKG showed sinus bradycardia, rate 58, but was otherwise normal.  DISCHARGE INSTRUCTIONS:  The patient was told to continue her home medications in addition to Plavix 75 mg daily for four weeks.  The patient was told to avoid any heavy lifting or strenuous activity.  She was to stay on a low salt, low fat, low cholesterol diet.  She was told to call the office if she had any problems with her groin area.  She is to follow up with Dr. Chales Abrahams in two to four weeks, Dr. Debby Bud as scheduled.  DISCHARGE DIAGNOSES: 1. Peripheral vascular disease, status post PTA stenting of the left    superficial femoral artery this admission. 2. Previous PTA stenting of the right superficial femoral artery. 3. Hypertension. 4. Chronic obstructive pulmonary disease by chest x-ray. 5. History of hiatal hernia. DD:  07/01/00 TD:  07/01/00 Job: [id number]

## 2010-10-15 NOTE — Procedures (Signed)
[place]. [facility]  Patient:    [name], [name]                         MRN: [id number] Proc. Date: 06/30/00 Adm. Date:  [id number] Attending:  Veneda Melter CC:         [facility]  Rosalyn Gess. Norins, M.D. [facility]   Procedure Report  PROCEDURES: 1. Abdominal aortogram. 2. Bilateral lower extremity angiogram. 3. Percutaneous transluminal angioplasty and stenting of the left superficial    femoral artery.  DIAGNOSES: 1. Peripheral vascular disease. 2. Claudication.  HISTORY:  [name] is an 77 year old white female who presents with worsening left lower extremity discomfort with ambulation.  The patient has a known history of peripheral vascular disease and had undergone percutaneous intervention to the right superficial femoral artery on July 16, 1999. She has done well with improved discomfort in the right calf; however, she has had progressive pain in the left calf.  She had known disease with a distal left superficial femoral artery of 70% at that time.  Ankle brachial indices on November 08, 1999, were 0.64 on the right and 0.48 on the left.  Due to progression of symptoms, she presents now for lower extremity angiogram with an eye toward percutaneous intervention in the left lower extremity.  DESCRIPTION OF PROCEDURE:  Informed consent was obtained.  The patient was brought to the catheterization lab, a 6 French sheath placed in the right femoral artery.  A 5 French pigtail catheter was advanced into the descending aorta and abdominal aortogram performed using power injection of contrast. Right lower extremity angiogram was then performed using power injection of contrast through the right femoral sheath.  Using an IMA catheter, a Wholey wire was placed across the iliac bifurcation in end-hole position in the left iliac artery.  Left lower extremity angiogram was then preformed using power injections of contrast.  Digital subtraction  angiography was used as well. Initial findings are as follows:  1. Abdominal aorta:  There is moderate atheromatous buildup in the infrarenal    segment without critical stenosis or dilatation.  The right renal artery is    single and patent with a mild narrowing of 30% in the proximal segment.    The left renal artery is single and patent with a high-grade narrowing of    70% in the proximal segment. 2. Right lower extremity:  The right iliac and common femoral artery have mild    diffuse disease of 30%.  Superficial femoral artery is patent.  There are    moderate narrowings of 50% in the proximal and midsection.  There is a long    segment of disease in the distal section extending into the proximal    popliteal artery of 70-80%.  This is in the site of previous intervention.    The anterior tibial artery is occluded in its proximal segment.  The    posterior tibial artery has moderate diffuse disease in a small-caliber    vessel.  The peroneal artery is the dominant vessel to the ankle. 3. Left lower extremity:  The left iliac artery has mild narrowings of 30%, as    has the common femoral artery, and the superficial femoral artery has a    mild narrowing of 50% in the proximal and midsection.  There is a long    segment of disease of 70% in the distal section, culminating in a 100%    occlusion  just above the popliteal.  The popliteal artery has moderate    diffuse disease of 50%.  The anterior tibial artery is again noted to be    occluded in the proximal segment.  The posterior tibial artery occludes    in its proximal segment, although it is seen to reconstitute distally.  The    peroneal artery extends to the ankle.  With these findings, we elected to proceed with percutaneous intervention to the left superficial femoral artery.  The [facility] wire was repositioned across the iliac bifurcation, and a 7 [place] _____ sheath placed into the left iliac artery.  The patient was given  heparin intravenously to maintain an ACT of greater than 250 seconds.  An angled glidewire was then introduced within an end-hole catheter and used to probe the occlusion.  This was successfully negotiated into the distal section of the vessel; however, the end-hole catheter could not be tracked through the occlusion.  This was removed and a 4 x 10 Powerflex balloon introduced.  This was positioned at the proximal segment of stenosis and inflated at 10 atmospheres for 30 seconds.  The balloon could now be advanced through the stenosis and was then dilated across the occlusion at 12 atmospheres for 30 seconds.  Two additional inflations were performed after the balloon was pulled back slightly to cover the distal SFA at 10 atmospheres for 60 seconds.  Repeat angiography now showed recannulation of the vessel and significant improvement in flow.  There was severe residual disease of 70-80% in the distal SFA at the site of occlusion where there was difficulty in passing equipment.  There was also moderate diffuse disease of 50-60% in the distal SFA.  A 6 x 2 smart stent was introduced and carefully deployed in the distal left superficial femoral artery at the site of occlusion.  A 4 x 2 Powerflex balloon was then introduced and two inflations performed at 16 and 20 atmospheres for 30 seconds to post-dilate the stent.  A 6 x 8 smart stent was then positioned proximal to the previously placed smart stent and deployed to cover the long area of disease in the distal SFA.  A 4 x 10 Powerflex balloon was then reintroduced and used to post-dilate the stent at 12 atmospheres for 60 seconds.  Two additional inflations were performed after advancing the balloon into the popliteal artery and in the distal SFA at 12 atmospheres for 120 seconds.  Repeat angiography was then performed showing excellent result with only ileal loop diversion residual narrowing of 20%.  There was no evidence of distal vessel  damage, and there was brisk flow through the SFA, popliteal  artery, and the distal vascular bed.  The _____ sheath was then brought back across the bifurcation and exchanged for a short 7 sheath.  This was secured in position.  The patient tolerated the procedure well and was transferred to the holding area in stable condition.  FINAL RESULT:  Successful percutaneous transluminal angioplasty and stenting of the left superficial femoral artery with reduction of 100% narrowing to less than 20% with placement of a 6 x 2 smart stent dilated to 4 mm, preceded by 6 x 8 smart stent, with reduction of 70% diffuse disease to less than 20%.  ASSESSMENT AND PLAN:  [name] is an 77 year old female with advanced peripheral vascular disease.  The patient has undergone recanalization of occluded left superficial femoral artery.  She has severe recurrence of disease in the right superficial femoral artery.  This  will be reassessed. Should the patient have recurrent symptoms, consideration may be given toward percutaneous intervention and stenting. DD:  06/30/00 TD:  07/01/00 Job: [id number]

## 2010-12-07 ENCOUNTER — Telehealth: Payer: Self-pay | Admitting: *Deleted

## 2010-12-07 NOTE — Telephone Encounter (Signed)
Pts daughter wants all her medications sent in for a 90 day supply to [facility] in [place]. Can I send these medications in for the pt?

## 2010-12-07 NOTE — Telephone Encounter (Signed)
Daughter called req a call back regarding pt's meds

## 2010-12-07 NOTE — Telephone Encounter (Signed)
k

## 2010-12-08 MED ORDER — AMLODIPINE BESYLATE 10 MG PO TABS: 10.0000 mg | ORAL_TABLET | Freq: Every day | ORAL | Status: DC

## 2010-12-08 MED ORDER — FUROSEMIDE 40 MG PO TABS: 40.0000 mg | ORAL_TABLET | Freq: Every day | ORAL | Status: DC

## 2010-12-08 MED ORDER — CILOSTAZOL 100 MG PO TABS: 100.0000 mg | ORAL_TABLET | Freq: Two times a day (BID) | ORAL | Status: DC

## 2010-12-08 MED ORDER — NEBIVOLOL HCL 2.5 MG PO TABS: 2.5000 mg | ORAL_TABLET | Freq: Every day | ORAL | Status: DC

## 2010-12-08 MED ORDER — OXYBUTYNIN CHLORIDE 5 MG PO TABS: 5.0000 mg | ORAL_TABLET | Freq: Three times a day (TID) | ORAL | Status: DC

## 2011-03-03 ENCOUNTER — Encounter: Payer: Self-pay | Admitting: Internal Medicine

## 2011-03-07 ENCOUNTER — Other Ambulatory Visit (INDEPENDENT_AMBULATORY_CARE_PROVIDER_SITE_OTHER): Payer: Medicare Other

## 2011-03-07 ENCOUNTER — Ambulatory Visit (INDEPENDENT_AMBULATORY_CARE_PROVIDER_SITE_OTHER): Payer: Medicare Other | Admitting: Internal Medicine

## 2011-03-07 ENCOUNTER — Encounter: Payer: Self-pay | Admitting: Internal Medicine

## 2011-03-07 DIAGNOSIS — J449 Chronic obstructive pulmonary disease, unspecified: Secondary | ICD-10-CM

## 2011-03-07 DIAGNOSIS — I10 Essential (primary) hypertension: Secondary | ICD-10-CM

## 2011-03-07 DIAGNOSIS — R609 Edema, unspecified: Secondary | ICD-10-CM

## 2011-03-07 DIAGNOSIS — R1013 Epigastric pain: Secondary | ICD-10-CM

## 2011-03-07 DIAGNOSIS — K3189 Other diseases of stomach and duodenum: Secondary | ICD-10-CM

## 2011-03-07 DIAGNOSIS — I739 Peripheral vascular disease, unspecified: Secondary | ICD-10-CM

## 2011-03-07 DIAGNOSIS — K219 Gastro-esophageal reflux disease without esophagitis: Secondary | ICD-10-CM

## 2011-03-07 DIAGNOSIS — Z23 Encounter for immunization: Secondary | ICD-10-CM

## 2011-03-07 DIAGNOSIS — H3530 Unspecified macular degeneration: Secondary | ICD-10-CM

## 2011-03-07 LAB — COMPREHENSIVE METABOLIC PANEL
ALT: 13 U/L (ref 0–35)
AST: 20 U/L (ref 0–37)
Albumin: 4.5 g/dL (ref 3.5–5.2)
Alkaline Phosphatase: 76 U/L (ref 39–117)
Potassium: 4.2 mEq/L (ref 3.5–5.1)
Sodium: 141 mEq/L (ref 135–145)
Total Protein: 8 g/dL (ref 6.0–8.3)

## 2011-03-07 LAB — CBC WITH DIFFERENTIAL/PLATELET
Basophils Absolute: 0 10*3/uL (ref 0.0–0.1)
Eosinophils Absolute: 0.1 10*3/uL (ref 0.0–0.7)
MCHC: 34 g/dL (ref 30.0–36.0)
MCV: 97.2 fl (ref 78.0–100.0)
Monocytes Absolute: 0.4 10*3/uL (ref 0.1–1.0)
Neutrophils Relative %: 62.9 % (ref 43.0–77.0)
Platelets: 211 10*3/uL (ref 150.0–400.0)

## 2011-03-07 NOTE — Patient Instructions (Signed)
Blood pressure - good control. Continue present medications. Will check routine labs: sodium, potassium and kidney function.  Swallowing - if this gets worse so that you cannot swallow you food we can get this looked at.  Arthritis - no obvious joint inflammation. To control the pain you may want to take tylenol 500 mg 2 tabs at a time two or three times a day. Do not wait to hurt but take this to control the pain.  Swelling in the feet - you already take a diuretic and your blood pressure is fine. Wear over the counter knee high support hose EVERY day from when you get up to when you go to bed.   Peripheral vascular disease - doing OK. Continuing pletal  Bladder control - it is fine to take the ditropan just once a day.  Care directive - keep the yellow DNR sheet and the pink MOST sheet together and ALWAYS take this with you to the ER or urgent care or any other medical facility.  You are doing GREAT. Thanks for coming to see me.

## 2011-03-07 NOTE — Progress Notes (Signed)
  Subjective:    Patient ID: [name], female    DOB: [birth year], 77 y.o.   MRN: [id number]  HPI [name] presents for rouinte medical follow-up. She has had a good year but she has progressive loss of vision; she has difficulty with swallowing - she follows each bite with a swallow of water. She has had an esophageal stricture in the past and has had dilatation in the past. However, she does get adequate nutrition per her grand-daughter. She does have a hard pain when she eats solids like meat. She does not want to pursue any endoscopic evaluation or treatment at this time.  She is having pain in the arms and hands from OA. Not taking any medication.  She reports good bowel movements. She eats peaches and has a natural laxative.    Review of Systems Constitutional:  Negative for fever, chills, activity change and unexpected weight change.  HEENT:  Negative for hearing loss, ear pain, congestion, neck stiffness and postnasal drip. Negative for sore throat or swallowing problems. Negative for dental complaints.   Eyes: Positive for vision loss and change in visual acuity due to macular degeneration.  Respiratory: Negative for chest tightness and wheezing. Negative for DOE.   Cardiovascular: Negative for chest pain or palpitations. Some decreased exercise tolerance Gastrointestinal: No change in bowel habit. No bloating or gas. No reflux or indigestion Genitourinary: Negative for urgency, frequency, flank pain and difficulty urinating.  Musculoskeletal: Negative for myalgias, back pain, arthralgias and gait problem.  Neurological: Negative for dizziness, tremors, weakness and headaches.  Hematological: Negative for adenopathy.  Psychiatric/Behavioral: Negative for behavioral problems and dysphoric mood.       Objective:   Physical Exam Vitals reviewed - stable Gen'l - elderly white woman who looks younger than her stated age who is in no distress. She is accompanied by her  grand-daughter/POA who lives in [place], [place]. HEENT - C&S clear, pupils are reactive, vision severely limited Neck - supple, w/o thyromegaly Nodes- negative Chest - no deformity Lungs - good BS, no rales, wheezes or rhonchi Cor - 2+ radial pulse, RRR, no murmurs Abdomen - BS+, soft, no guarding or rebound Extremities - no deformity noted. Bilateral pedal edema noted, R>L. Neuro - mild loss of hearing, loss of vision, speech clear, cognition and recall normal. Mood and affect positive  Lab Results  Component Value Date   WBC 7.8 03/07/2011   HGB 14.9 03/07/2011   HCT 43.9 03/07/2011   PLT 211.0 03/07/2011   GLUCOSE 111* 03/07/2011   CHOL 179 03/14/2007   TRIG 337* 03/14/2007   HDL 36.2* 03/14/2007   LDLDIRECT 91.0 03/14/2007   ALT 13 03/07/2011   AST 20 03/07/2011   NA 141 03/07/2011   K 4.2 03/07/2011   CL 101 03/07/2011   CREATININE 1.2 03/07/2011   BUN 19 03/07/2011   CO2 31 03/07/2011   TSH 2.70 03/07/2011          Assessment & Plan:

## 2011-03-08 NOTE — Assessment & Plan Note (Signed)
Progressive vision loss - functionally blind at this point

## 2011-03-08 NOTE — Assessment & Plan Note (Signed)
No evidence of skin breakdown or ulcerations. She is not limited in her activities and does not report any leg pain.  Plan - continue Pletal BID

## 2011-03-08 NOTE — Assessment & Plan Note (Signed)
No respiratory difficulty/increased work of breathing. No new recommendations at this time.

## 2011-03-08 NOTE — Assessment & Plan Note (Addendum)
Stable with no complaints at this visit. She is taking no medications at this time.

## 2011-03-08 NOTE — Assessment & Plan Note (Signed)
No evidence of heart failure or renal disease. Working diagnosis is venous insufficiency. No stasis dermatitis or skin breakdown.   Plan - use of knee high otc support hose.

## 2011-03-08 NOTE — Assessment & Plan Note (Signed)
BP Readings from Last 3 Encounters:  03/07/11 142/60  07/05/10 160/60  02/16/10 162/56   Adequate control on present medications. Labs are OK with normal renal function and electrolytes.  Plan - continue present medications

## 2011-07-12 ENCOUNTER — Encounter: Payer: Self-pay | Admitting: Cardiovascular Disease

## 2011-07-12 ENCOUNTER — Ambulatory Visit (INDEPENDENT_AMBULATORY_CARE_PROVIDER_SITE_OTHER): Payer: Medicare Other | Admitting: Cardiovascular Disease

## 2011-07-12 VITALS — BP 163/69 | HR 66 | Ht 59.0 in | Wt 136.0 lb

## 2011-07-12 DIAGNOSIS — R609 Edema, unspecified: Secondary | ICD-10-CM

## 2011-07-12 MED ORDER — FUROSEMIDE 40 MG PO TABS: 40.0000 mg | ORAL_TABLET | Freq: Two times a day (BID) | ORAL | Status: DC

## 2011-07-12 NOTE — Progress Notes (Signed)
History of Present Illness: 78 yo WF with history of HTN, macular degeneration, COPD and PVD with chronic mild lower extremity edema here for PV follow up. She has been known for some time to have at least moderate lower extremity arterial disease with ABI less than 0.5 bilaterally. I last saw her in February 2012. She is followed by Dr. Oliver Barre in primary care. She denies claudication or rest pain but does have numbness in legs with walking.  She continues to have daytime lower ext edema. No CP, SOB. In 2012, she was admitted to [facility] for hematchezia felt to be secondary to be secondary to a diverticular bleed. Her Plavix was stopped in the hospital. Last ABI in February 2012 0.49 on the right and 0.51 on the left.   She tells me today that she has been doing well. Her swelling seems to be increased. She has very little salt intake. She has been wearing compression stockings. She elevates her legs while sitting. No SOB or chest pain. She has lost 40 pounds since her last visit here in our office one year ago.   Past Medical History  Diagnosis Date  . Peripheral vascular disease   . Peripheral edema   . COPD (chronic obstructive pulmonary disease)   . Hypertension   . Macular degeneration     bilateral  . Irritable bladder   . Rectal fissure   . Enteritis   . Duodenitis   . Esophagitis   . DJD (degenerative joint disease)   . Dyspepsia   . GERD (gastroesophageal reflux disease)     Past Surgical History  Procedure Date  . Mole excision   . Cataract surg   . Laparotomy for treatment of adhession   . Appendectomy     Current Outpatient Prescriptions  Medication Sig Dispense Refill  . acetaminophen (TYLENOL ARTHRITIS PAIN) 650 MG CR tablet 1 tab twice a day (DR Norins)      . amLODipine (NORVASC) 10 MG tablet Take 1 tablet (10 mg total) by mouth daily.  90 tablet  3  . aspirin 81 MG tablet Take 81 mg by mouth daily.        . calcium gluconate 500 MG tablet Take 500 mg  by mouth daily.        . cilostazol (PLETAL) 100 MG tablet Take 1 tablet (100 mg total) by mouth 2 (two) times daily.  180 tablet  3  . furosemide (LASIX) 40 MG tablet Take 1 tablet (40 mg total) by mouth daily.  90 tablet  3  . nebivolol (BYSTOLIC) 2.5 MG tablet Take 1 tablet (2.5 mg total) by mouth daily.  90 tablet  3  . OVER THE COUNTER MEDICATION Eye drops daily      . oxybutynin (DITROPAN) 5 MG tablet Take 5 mg by mouth 1 day or 1 dose.          No Known Allergies  History   Social History  . Marital Status: Widowed    Spouse Name: N/A    Number of Children: N/A  . Years of Education: N/A   Occupational History  . Not on file.   Social History Main Topics  . Smoking status: Never Smoker   . Smokeless tobacco: Never Used  . Alcohol Use: No  . Drug Use: No  . Sexually Active: No   Other Topics Concern  . Not on file   Social History Narrative   Lives with her son. I-ADLs. Widow x 43  years. She drinks daily coffee. ACP-PT is DNR; reviewed and completed MOST form: no intensive care, antibiotics after evaluation, short-term IVF, no tube feeding. Grand-daughter/POA present. (Oct '12)    No family history on file.  Review of Systems:  As stated in the HPI and otherwise negative.   BP 163/69  Pulse 66  Ht 4\' 11"  (1.499 m)  Wt 136 lb (61.689 kg)  BMI 27.47 kg/m2  Physical Examination: General: Well developed, well nourished, NAD HEENT: OP clear, mucus membranes moist SKIN: warm, dry. No rashes. Neuro: No focal deficits Musculoskeletal: Muscle strength 5/5 all ext Psychiatric: Mood and affect normal Neck: No JVD, no carotid bruits, no thyromegaly, no lymphadenopathy. Lungs:Clear bilaterally, no wheezes, rhonci, crackles Cardiovascular: Regular rate and rhythm. No murmurs, gallops or rubs. Abdomen:Soft. Bowel sounds present. Non-tender.  Extremities: 2+ bilateral lower extremity edema. Pulses difficult to palpate.

## 2011-07-12 NOTE — Assessment & Plan Note (Addendum)
She has chronic dependent edema in both lower extremities. She has been on once daily Lasix 40 mg. Now that her edema has worsened, will increase Lasix to 40 mg po BID. I suspect that her nutrition level is down and this is likely contributing to her edema with third spacing of fluid. Will check BMET in one week with increase in Lasix. She is encouraged to eat a banana every day for potassium supplementation.

## 2011-07-12 NOTE — Assessment & Plan Note (Signed)
She is known to have moderate LE arterial disease with ABI around 0.5 in each leg. Given her lack of symptoms and advanced age, will not pursue further non-invasive lower extremity testing.

## 2011-07-12 NOTE — Patient Instructions (Signed)
Your physician wants you to follow-up in: 12 months. You will receive a reminder letter in the mail two months in advance. If you don't receive a letter, please call our office to schedule the follow-up appointment.  Your physician has recommended you make the following change in your medication: Increase furosemide to 40 mg by mouth twice daily.  Your physician recommends that you return for lab work in: one week.  BMP

## 2011-07-20 ENCOUNTER — Other Ambulatory Visit (INDEPENDENT_AMBULATORY_CARE_PROVIDER_SITE_OTHER): Payer: Medicare Other

## 2011-07-20 DIAGNOSIS — R609 Edema, unspecified: Secondary | ICD-10-CM

## 2011-07-20 LAB — BASIC METABOLIC PANEL
CO2: 32 mEq/L (ref 19–32)
Calcium: 9.8 mg/dL (ref 8.4–10.5)
Creatinine, Ser: 1.2 mg/dL (ref 0.4–1.2)
Glucose, Bld: 102 mg/dL — ABNORMAL HIGH (ref 70–99)
Sodium: 141 mEq/L (ref 135–145)

## 2011-09-20 ENCOUNTER — Other Ambulatory Visit: Payer: Self-pay | Admitting: *Deleted

## 2011-09-20 MED ORDER — OXYBUTYNIN CHLORIDE 5 MG PO TABS: 5.0000 mg | ORAL_TABLET | Freq: Every day | ORAL | Status: DC

## 2011-09-20 NOTE — Telephone Encounter (Signed)
Researched EMR, pt's SIG: changed at last OV 10.08.12; correct dosage sent to pharmacy/[facility]

## 2011-12-21 ENCOUNTER — Other Ambulatory Visit: Payer: Self-pay

## 2011-12-21 DIAGNOSIS — R609 Edema, unspecified: Secondary | ICD-10-CM

## 2011-12-21 MED ORDER — FUROSEMIDE 40 MG PO TABS: 40.0000 mg | ORAL_TABLET | Freq: Two times a day (BID) | ORAL | Status: AC

## 2011-12-21 MED ORDER — AMLODIPINE BESYLATE 10 MG PO TABS: 10.0000 mg | ORAL_TABLET | Freq: Every day | ORAL | Status: DC

## 2011-12-21 MED ORDER — OXYBUTYNIN CHLORIDE 5 MG PO TABS: 5.0000 mg | ORAL_TABLET | Freq: Every day | ORAL | Status: DC

## 2011-12-21 MED ORDER — CILOSTAZOL 100 MG PO TABS: 100.0000 mg | ORAL_TABLET | Freq: Two times a day (BID) | ORAL | Status: DC

## 2011-12-21 MED ORDER — NEBIVOLOL HCL 2.5 MG PO TABS: 2.5000 mg | ORAL_TABLET | Freq: Every day | ORAL | Status: DC

## 2012-04-11 ENCOUNTER — Other Ambulatory Visit (INDEPENDENT_AMBULATORY_CARE_PROVIDER_SITE_OTHER): Payer: Medicare Other

## 2012-04-11 ENCOUNTER — Encounter: Payer: Self-pay | Admitting: Internal Medicine

## 2012-04-11 ENCOUNTER — Ambulatory Visit (INDEPENDENT_AMBULATORY_CARE_PROVIDER_SITE_OTHER): Payer: Medicare Other | Admitting: Internal Medicine

## 2012-04-11 VITALS — BP 140/62 | HR 73 | Temp 98.0°F | Resp 16 | Wt 130.1 lb

## 2012-04-11 DIAGNOSIS — J4489 Other specified chronic obstructive pulmonary disease: Secondary | ICD-10-CM

## 2012-04-11 DIAGNOSIS — J449 Chronic obstructive pulmonary disease, unspecified: Secondary | ICD-10-CM

## 2012-04-11 DIAGNOSIS — Z23 Encounter for immunization: Secondary | ICD-10-CM

## 2012-04-11 DIAGNOSIS — Z0000 Encounter for general adult medical examination without abnormal findings: Secondary | ICD-10-CM

## 2012-04-11 DIAGNOSIS — K219 Gastro-esophageal reflux disease without esophagitis: Secondary | ICD-10-CM

## 2012-04-11 DIAGNOSIS — H3530 Unspecified macular degeneration: Secondary | ICD-10-CM

## 2012-04-11 DIAGNOSIS — R609 Edema, unspecified: Secondary | ICD-10-CM

## 2012-04-11 DIAGNOSIS — I10 Essential (primary) hypertension: Secondary | ICD-10-CM

## 2012-04-11 DIAGNOSIS — Z515 Encounter for palliative care: Secondary | ICD-10-CM

## 2012-04-11 LAB — COMPREHENSIVE METABOLIC PANEL
ALT: 14 U/L (ref 0–35)
AST: 17 U/L (ref 0–37)
Alkaline Phosphatase: 67 U/L (ref 39–117)
BUN: 21 mg/dL (ref 6–23)
Calcium: 9.7 mg/dL (ref 8.4–10.5)
Chloride: 98 mEq/L (ref 96–112)
Creatinine, Ser: 1.3 mg/dL — ABNORMAL HIGH (ref 0.4–1.2)
Potassium: 3.9 mEq/L (ref 3.5–5.1)

## 2012-04-11 LAB — CBC WITH DIFFERENTIAL/PLATELET
Basophils Absolute: 0.1 10*3/uL (ref 0.0–0.1)
Basophils Relative: 0.6 % (ref 0.0–3.0)
Eosinophils Absolute: 0.1 10*3/uL (ref 0.0–0.7)
Hemoglobin: 14.5 g/dL (ref 12.0–15.0)
Lymphs Abs: 2.5 10*3/uL (ref 0.7–4.0)
MCHC: 33.5 g/dL (ref 30.0–36.0)
MCV: 97.9 fl (ref 78.0–100.0)
Monocytes Absolute: 0.5 10*3/uL (ref 0.1–1.0)
Neutro Abs: 5.3 10*3/uL (ref 1.4–7.7)
RBC: 4.41 Mil/uL (ref 3.87–5.11)
RDW: 12.7 % (ref 11.5–14.6)

## 2012-04-11 NOTE — Progress Notes (Signed)
  Subjective:    Patient ID: [name], female    DOB: [birth year], 78 y.o.   MRN: [id number]  HPI The patient is here for annual Medicare wellness examination and management of other chronic and acute problems.   The risk factors are reflected in the social history.  The roster of all physicians providing medical care to patient - is listed in the Snapshot section of the chart.  Activities of daily living:  The patient is 100% inedpendent in all ADLs: dressing, toileting, feeding as well as independent mobility  Home safety : The patient has smoke detectors in the home. They wear seatbelts.  firearms are present in the home, kept in a safe fashion. There is no violence in the home.   There is no risks for hepatitis, STDs or HIV. There is no   history of blood transfusion. They have no travel history to infectious disease endemic areas of the world.  The patient has not seen their dentist. Has full dentures.. They have not seen their eye doctor in the last year. They deny any hearing difficulty and have not had audiologic testing in the last year.  They do not  have excessive sun exposure. Discussed the need for sun protection: hats, long sleeves and use of sunscreen if there is significant sun exposure.   Diet: the importance of a healthy diet is discussed. They do have a healthy diet.  The patient has no regular exercise program.  The benefits of regular aerobic exercise were discussed, but given her advanced age and visual limitations no recommendations were made.  Depression screen: there are no signs or vegative symptoms of depression- irritability, change in appetite, anhedonia, sadness/tearfullness. She has a great attitude  Cognitive assessment: the patient is actively engaged.  The following portions of the patient's history were reviewed and updated as appropriate: allergies, current medications, past family history, past medical history,  past surgical history, past social  history  and problem list.  Vision, hearing, body mass index were assessed and reviewed.   During the course of the visit the patient was educated and counseled about appropriate screening and preventive services including : fall prevention , diabetes screening, nutrition counseling, colorectal cancer screening, and recommended immunizations.    Review of Systems System review is negative for any constitutional, cardiac, pulmonary, GI or neuro symptoms or complaints other than as described in the HPI.     Objective:   Physical Exam Filed Vitals:   04/11/12 1320  BP: 140/62  Pulse: 73  Temp: 98 F (36.7 C)  Resp: 16   Gen'l;- very well preserved elderly white woman  HEENT - C&S, poor vision - macular degeneration. Edentulous. Neck - supple Nodes - negative Cor - 2+ RRR PUlm - normal respirations Abd soft Neuro - A&O x 3, Cognition is normal; has preserved peripheral vision but it is poor - cannot read, must get close to see, normal strength - rise from chair w/o assistance, ambulates independently       Assessment & Plan:

## 2012-04-11 NOTE — Patient Instructions (Addendum)
You are looking good. Ears are clear and no sign of perforation. Heart and lungs are OK. Skin on the legs looks dry but no serious skin disease.   Will check routine labs today.  Reaffirmed ACP- patients does not want CPR, DNI, no heroic measures.   All things considered you are in pretty good condition for the condition that you are in.

## 2012-04-12 ENCOUNTER — Encounter: Payer: Self-pay | Admitting: Internal Medicine

## 2012-04-12 DIAGNOSIS — Z515 Encounter for palliative care: Secondary | ICD-10-CM | POA: Insufficient documentation

## 2012-04-12 NOTE — Assessment & Plan Note (Signed)
Reaffirmed her End of LIfe Care wishes with her grand daughter [name]) present.

## 2012-04-12 NOTE — Assessment & Plan Note (Signed)
Minimal distal LE edema w/o pitting

## 2012-04-12 NOTE — Assessment & Plan Note (Signed)
No respiratory distress, no limitation in ADLs

## 2012-04-12 NOTE — Assessment & Plan Note (Signed)
No change in condition. She has adequate remaining vision to be able to ambulate independently with care.

## 2012-04-12 NOTE — Assessment & Plan Note (Signed)
BP Readings from Last 3 Encounters:  04/11/12 140/62  07/12/11 163/69  03/07/11 142/60   Good control. No change in medication.

## 2012-06-17 ENCOUNTER — Other Ambulatory Visit: Payer: Self-pay | Admitting: Internal Medicine

## 2012-06-18 ENCOUNTER — Other Ambulatory Visit: Payer: Self-pay | Admitting: *Deleted

## 2012-06-18 MED ORDER — NEBIVOLOL HCL 2.5 MG PO TABS: 2.5000 mg | ORAL_TABLET | Freq: Every day | ORAL | Status: DC

## 2012-06-18 MED ORDER — CILOSTAZOL 100 MG PO TABS: 100.0000 mg | ORAL_TABLET | Freq: Two times a day (BID) | ORAL | Status: DC

## 2012-06-18 MED ORDER — AMLODIPINE BESYLATE 10 MG PO TABS: 10.0000 mg | ORAL_TABLET | Freq: Every day | ORAL | Status: DC

## 2012-07-05 ENCOUNTER — Ambulatory Visit (INDEPENDENT_AMBULATORY_CARE_PROVIDER_SITE_OTHER): Payer: Medicare Other | Admitting: Cardiovascular Disease

## 2012-07-05 ENCOUNTER — Encounter: Payer: Self-pay | Admitting: Cardiovascular Disease

## 2012-07-05 VITALS — BP 155/54 | HR 75 | Ht 60.0 in | Wt 130.0 lb

## 2012-07-05 DIAGNOSIS — I739 Peripheral vascular disease, unspecified: Secondary | ICD-10-CM

## 2012-07-05 NOTE — Progress Notes (Signed)
History of Present Illness: 79 yo WF with history of HTN, macular degeneration, COPD and PVD with chronic mild lower extremity edema here for PV follow up. She has been known for some time to have at least moderate lower extremity arterial disease with ABI less than 0.5 bilaterally. I last saw her in February 2012. She is followed by Dr. Oliver Barre in primary care. She denies claudication or rest pain but does have numbness in legs with walking. She continues to have daytime lower ext edema. No CP, SOB. In 2012, she was admitted to [facility] for hematchezia felt to be secondary to be secondary to a diverticular bleed. Her Plavix was stopped in the hospital. Last ABI in February 2012 0.49 on the right and 0.51 on the left. We have elected for conservative therapy of her PAD.   She tells me today that she has been doing well. Her lower extremity swelling is down and has been stable. She has very little salt intake. She has been wearing compression stockings. She elevates her legs while sitting. No SOB or chest pain.   Primary Care Physician: Norins  Past Medical History  Diagnosis Date  . Peripheral vascular disease   . Peripheral edema   . COPD (chronic obstructive pulmonary disease)   . Hypertension   . Macular degeneration     bilateral  . Irritable bladder   . Rectal fissure   . Enteritis   . Duodenitis   . Esophagitis   . DJD (degenerative joint disease)   . Dyspepsia   . GERD (gastroesophageal reflux disease)     Past Surgical History  Procedure Date  . Mole excision   . Cataract surg   . Laparotomy for treatment of adhession   . Appendectomy     Current Outpatient Prescriptions  Medication Sig Dispense Refill  . acetaminophen (TYLENOL ARTHRITIS PAIN) 650 MG CR tablet 1 tab twice a day (DR Norins)      . amLODipine (NORVASC) 10 MG tablet Take 1 tablet (10 mg total) by mouth daily.  90 tablet  1  . aspirin 81 MG tablet Take 81 mg by mouth daily.        . calcium  gluconate 500 MG tablet Take 500 mg by mouth daily.        . cilostazol (PLETAL) 100 MG tablet Take 1 tablet (100 mg total) by mouth 2 (two) times daily.  180 tablet  1  . furosemide (LASIX) 40 MG tablet Take 1 tablet (40 mg total) by mouth 2 (two) times daily.  180 tablet  1  . nebivolol (BYSTOLIC) 2.5 MG tablet Take 1 tablet (2.5 mg total) by mouth daily.  90 tablet  1  . OVER THE COUNTER MEDICATION Eye drops daily      . oxybutynin (DITROPAN) 5 MG tablet Take 1 tablet (5 mg total) by mouth daily.  90 tablet  1    Allergies  Allergen Reactions  . Codone (Hydrocodone)     hallucinations    History   Social History  . Marital Status: Widowed    Spouse Name: N/A    Number of Children: N/A  . Years of Education: N/A   Occupational History  . Not on file.   Social History Main Topics  . Smoking status: Never Smoker   . Smokeless tobacco: Never Used  . Alcohol Use: No  . Drug Use: No  . Sexually Active: No   Other Topics Concern  . Not on file  Social History Narrative   Lives with her son. I-ADLs. Widow x 43 years. She drinks daily coffee. ACP-PT is DNR/DNI; reviewed and completed MOST form: no intensive care, antibiotics after evaluation, short-term IVF, no tube feeding. Grand-daughter/POA present. (Oct '12)    No family history on file.  Review of Systems:  As stated in the HPI and otherwise negative.   BP 155/54  Pulse 75  Ht 5' (1.524 m)  Wt 130 lb (58.968 kg)  BMI 25.39 kg/m2  Physical Examination: General: Well developed, well nourished, NAD HEENT: OP clear, mucus membranes moist SKIN: warm, dry. No rashes. Neuro: No focal deficits Musculoskeletal: Muscle strength 5/5 all ext Psychiatric: Mood and affect normal Neck: No JVD, no carotid bruits, no thyromegaly, no lymphadenopathy. Lungs:Clear bilaterally, no wheezes, rhonci, crackles Cardiovascular: Regular rate and rhythm. No murmurs, gallops or rubs. Abdomen:Soft. Bowel sounds present. Non-tender.    Extremities: No lower extremity edema. Pulses are trace in the bilateral DP/PT.  Assessment and Plan:   1. PERIPHERAL EDEMA: She has chronic dependent edema in both lower extremities. Stable. Continue Lasix 40 mg po BID. She is eating a tomato every day and bananas several times per week.    2. PERIPHERAL VASCULAR DISEASE: She is known to have moderate LE arterial disease with ABI around 0.5 in each leg. Given her lack of symptoms and advanced age, will not pursue further non-invasive lower extremity testing. She is not a candidate for invasive measures given her age.

## 2012-07-05 NOTE — Patient Instructions (Addendum)
Your physician wants you to follow-up in: 12 months.  You will receive a reminder letter in the mail two months in advance. If you don't receive a letter, please call our office to schedule the follow-up appointment.  Your physician recommends that you continue on your current medications as directed. Please refer to the Current Medication list given to you today.   

## 2012-09-12 ENCOUNTER — Other Ambulatory Visit: Payer: Self-pay | Admitting: Cardiovascular Disease

## 2012-09-14 ENCOUNTER — Other Ambulatory Visit: Payer: Self-pay | Admitting: Cardiovascular Disease

## 2012-12-12 ENCOUNTER — Other Ambulatory Visit: Payer: Self-pay | Admitting: Internal Medicine

## 2012-12-12 ENCOUNTER — Other Ambulatory Visit: Payer: Self-pay | Admitting: Cardiovascular Disease

## 2013-01-07 ENCOUNTER — Encounter: Payer: Self-pay | Admitting: Internal Medicine

## 2013-01-07 ENCOUNTER — Inpatient Hospital Stay (HOSPITAL_COMMUNITY): Payer: Medicare Other

## 2013-01-07 ENCOUNTER — Ambulatory Visit (INDEPENDENT_AMBULATORY_CARE_PROVIDER_SITE_OTHER): Payer: Medicare Other | Admitting: Internal Medicine

## 2013-01-07 ENCOUNTER — Inpatient Hospital Stay (HOSPITAL_COMMUNITY)
Admission: AD | Admit: 2013-01-07 | Discharge: 2013-01-16 | DRG: 252 | Disposition: A | Discharge status: Hospice | Payer: Medicare Other | Source: Ambulatory Visit | Attending: Internal Medicine | Admitting: Internal Medicine

## 2013-01-07 VITALS — BP 134/60 | HR 105 | Temp 98.0°F | Wt 139.4 lb

## 2013-01-07 DIAGNOSIS — J449 Chronic obstructive pulmonary disease, unspecified: Secondary | ICD-10-CM | POA: Diagnosis present

## 2013-01-07 DIAGNOSIS — I4891 Unspecified atrial fibrillation: Principal | ICD-10-CM | POA: Diagnosis present

## 2013-01-07 DIAGNOSIS — I5041 Acute combined systolic (congestive) and diastolic (congestive) heart failure: Secondary | ICD-10-CM | POA: Diagnosis present

## 2013-01-07 DIAGNOSIS — N289 Disorder of kidney and ureter, unspecified: Secondary | ICD-10-CM | POA: Diagnosis present

## 2013-01-07 DIAGNOSIS — I739 Peripheral vascular disease, unspecified: Secondary | ICD-10-CM | POA: Diagnosis present

## 2013-01-07 DIAGNOSIS — N3289 Other specified disorders of bladder: Secondary | ICD-10-CM | POA: Diagnosis present

## 2013-01-07 DIAGNOSIS — Z515 Encounter for palliative care: Secondary | ICD-10-CM

## 2013-01-07 DIAGNOSIS — I10 Essential (primary) hypertension: Secondary | ICD-10-CM | POA: Diagnosis present

## 2013-01-07 DIAGNOSIS — I824Y9 Acute embolism and thrombosis of unspecified deep veins of unspecified proximal lower extremity: Secondary | ICD-10-CM | POA: Diagnosis present

## 2013-01-07 DIAGNOSIS — J4489 Other specified chronic obstructive pulmonary disease: Secondary | ICD-10-CM | POA: Diagnosis present

## 2013-01-07 DIAGNOSIS — K219 Gastro-esophageal reflux disease without esophagitis: Secondary | ICD-10-CM | POA: Diagnosis present

## 2013-01-07 DIAGNOSIS — Z66 Do not resuscitate: Secondary | ICD-10-CM | POA: Diagnosis present

## 2013-01-07 DIAGNOSIS — R4181 Age-related cognitive decline: Secondary | ICD-10-CM | POA: Diagnosis present

## 2013-01-07 DIAGNOSIS — IMO0002 Reserved for concepts with insufficient information to code with codable children: Secondary | ICD-10-CM

## 2013-01-07 DIAGNOSIS — D509 Iron deficiency anemia, unspecified: Secondary | ICD-10-CM | POA: Diagnosis present

## 2013-01-07 DIAGNOSIS — R64 Cachexia: Secondary | ICD-10-CM | POA: Diagnosis present

## 2013-01-07 DIAGNOSIS — I509 Heart failure, unspecified: Secondary | ICD-10-CM | POA: Diagnosis present

## 2013-01-07 DIAGNOSIS — Z9089 Acquired absence of other organs: Secondary | ICD-10-CM

## 2013-01-07 DIAGNOSIS — I82403 Acute embolism and thrombosis of unspecified deep veins of lower extremity, bilateral: Secondary | ICD-10-CM

## 2013-01-07 DIAGNOSIS — H3530 Unspecified macular degeneration: Secondary | ICD-10-CM | POA: Diagnosis present

## 2013-01-07 DIAGNOSIS — H548 Legal blindness, as defined in USA: Secondary | ICD-10-CM | POA: Diagnosis present

## 2013-01-07 DIAGNOSIS — M1990 Unspecified osteoarthritis, unspecified site: Secondary | ICD-10-CM | POA: Diagnosis present

## 2013-01-07 DIAGNOSIS — I2510 Atherosclerotic heart disease of native coronary artery without angina pectoris: Secondary | ICD-10-CM | POA: Diagnosis present

## 2013-01-07 DIAGNOSIS — K5900 Constipation, unspecified: Secondary | ICD-10-CM | POA: Diagnosis present

## 2013-01-07 DIAGNOSIS — R609 Edema, unspecified: Secondary | ICD-10-CM

## 2013-01-07 DIAGNOSIS — M7989 Other specified soft tissue disorders: Secondary | ICD-10-CM

## 2013-01-07 DIAGNOSIS — R0602 Shortness of breath: Secondary | ICD-10-CM

## 2013-01-07 DIAGNOSIS — I5021 Acute systolic (congestive) heart failure: Secondary | ICD-10-CM

## 2013-01-07 DIAGNOSIS — I5031 Acute diastolic (congestive) heart failure: Secondary | ICD-10-CM

## 2013-01-07 DIAGNOSIS — I824Z9 Acute embolism and thrombosis of unspecified deep veins of unspecified distal lower extremity: Secondary | ICD-10-CM | POA: Diagnosis present

## 2013-01-07 LAB — COMPREHENSIVE METABOLIC PANEL
ALT: 10 U/L (ref 0–35)
AST: 17 U/L (ref 0–37)
Albumin: 3.5 g/dL (ref 3.5–5.2)
Alkaline Phosphatase: 64 U/L (ref 39–117)
CO2: 30 mEq/L (ref 19–32)
Chloride: 97 mEq/L (ref 96–112)
Creatinine, Ser: 1.44 mg/dL — ABNORMAL HIGH (ref 0.50–1.10)
Potassium: 3 mEq/L — ABNORMAL LOW (ref 3.5–5.1)
Sodium: 140 mEq/L (ref 135–145)
Total Bilirubin: 0.3 mg/dL (ref 0.3–1.2)

## 2013-01-07 LAB — TROPONIN I: Troponin I: <0.3 ng/mL (ref <0.30)

## 2013-01-07 MED ORDER — DILTIAZEM HCL 100 MG IV SOLR
5.0000 mg/h | INTRAVENOUS | Status: DC
  Administered 2013-01-07: 5 mg/h via INTRAVENOUS
  Filled 2013-01-07: qty 100

## 2013-01-07 MED ORDER — ENOXAPARIN SODIUM 30 MG/0.3ML ~~LOC~~ SOLN
30.0000 mg | SUBCUTANEOUS | Status: DC
  Administered 2013-01-07 – 2013-01-11 (×5): 30 mg via SUBCUTANEOUS
  Filled 2013-01-07 (×5): qty 0.3

## 2013-01-07 MED ORDER — FUROSEMIDE 40 MG PO TABS
40.0000 mg | ORAL_TABLET | Freq: Two times a day (BID) | ORAL | Status: DC
  Administered 2013-01-07 – 2013-01-08 (×3): 40 mg via ORAL
  Filled 2013-01-07 (×6): qty 1

## 2013-01-07 MED ORDER — SENNA 8.6 MG PO TABS
1.0000 | ORAL_TABLET | Freq: Two times a day (BID) | ORAL | Status: DC
  Administered 2013-01-07 – 2013-01-16 (×18): 8.6 mg via ORAL
  Filled 2013-01-07 (×19): qty 1

## 2013-01-07 MED ORDER — CILOSTAZOL 100 MG PO TABS
100.0000 mg | ORAL_TABLET | Freq: Two times a day (BID) | ORAL | Status: DC
Start: 2013-01-07 — End: 2013-01-16
  Administered 2013-01-07 – 2013-01-16 (×18): 100 mg via ORAL
  Filled 2013-01-07 (×19): qty 1

## 2013-01-07 MED ORDER — HYDROCODONE-ACETAMINOPHEN 5-325 MG PO TABS
1.0000 | ORAL_TABLET | ORAL | Status: DC | PRN
  Filled 2013-01-07: qty 1

## 2013-01-07 MED ORDER — ACETAMINOPHEN 325 MG PO TABS
650.0000 mg | ORAL_TABLET | Freq: Two times a day (BID) | ORAL | Status: DC
  Administered 2013-01-07 – 2013-01-16 (×18): 650 mg via ORAL
  Filled 2013-01-07 (×20): qty 2

## 2013-01-07 MED ORDER — NEBIVOLOL HCL 2.5 MG PO TABS
2.5000 mg | ORAL_TABLET | Freq: Every day | ORAL | Status: DC
  Administered 2013-01-07 – 2013-01-10 (×4): 2.5 mg via ORAL
  Filled 2013-01-07 (×4): qty 1

## 2013-01-07 MED ORDER — SODIUM CHLORIDE 0.9 % IJ SOLN
3.0000 mL | Freq: Two times a day (BID) | INTRAMUSCULAR | Status: DC
  Administered 2013-01-08 – 2013-01-16 (×12): 3 mL via INTRAVENOUS

## 2013-01-07 MED ORDER — OXYBUTYNIN CHLORIDE 5 MG PO TABS
5.0000 mg | ORAL_TABLET | Freq: Every day | ORAL | Status: DC
  Administered 2013-01-07 – 2013-01-16 (×10): 5 mg via ORAL
  Filled 2013-01-07 (×10): qty 1

## 2013-01-07 MED ORDER — SODIUM CHLORIDE 0.45 % IV SOLN: 50.0000 mL/h | INTRAVENOUS | Status: DC

## 2013-01-07 MED ORDER — ASPIRIN 81 MG PO CHEW
81.0000 mg | CHEWABLE_TABLET | Freq: Every day | ORAL | Status: DC
  Administered 2013-01-08 – 2013-01-16 (×9): 81 mg via ORAL
  Filled 2013-01-07 (×11): qty 1

## 2013-01-07 NOTE — Progress Notes (Signed)
Subjective:     Patient ID: [name], female   DOB: 1912/03/31, 79 y.o.   MRN: [id number]  HPI Comments: [name] is a 79 year-old lady with a past medical history of hypertension and peripheral vascular disease who presents today due to ankle swelling of two weeks' duration.  Amber peripheral vascular disease is managed by Dr. Rose Fillers; Amber last visit was 07/05/12.  She edema is normally managed with 40 mg lasix BID.  Ten days ago, she changed to 60 mg BID on the advice of [name], which stabilized the swelling but did not reduce swelling.  Prior to that, little swollen but could be remedied by propping feet up.  Compression socks have not helped today and have not been worn other days (neither [name] nor [name] can put them on Amber).  No alleviating or exacerbating factors.  She has lately been less mobile than usual since [name] had a hernia surgery, so Amber mobility has been greatly restricted.  She denies any dietary changes.  She denies any SOB, cough, chest pain or racing heart.  Denies any numbness or tingling.  She does describe weakness of a few years duration.    Additionally, [name] wants a prescription for skilled nurse to check in on Amber to monitor Amber health every week or so.  [facility] [phone].  She needs to be diagnosed as homebound due to Amber macular degeneration, inability to drive, and that she can't walk indoors without assistance.    Review of Systems  Constitutional: Positive for activity change and unexpected weight change. Negative for fever, chills, diaphoresis and fatigue.       9 pound weight change, probably largely due to fluid weight.  HENT: Positive for neck pain. Negative for facial swelling.   Respiratory: Negative for choking, chest tightness, shortness of breath and stridor.   Cardiovascular: Positive for leg swelling. Negative for chest pain and palpitations.  Gastrointestinal: Positive for constipation. Negative for nausea,  vomiting and diarrhea.  Endocrine: Negative for polydipsia and polyuria.  Musculoskeletal: Positive for joint swelling. Negative for arthralgias.  Neurological: Positive for headaches. Negative for dizziness and light-headedness.       HA for last few days in front of head   Past Medical History  Diagnosis Date   Peripheral vascular disease    Peripheral edema    COPD (chronic obstructive pulmonary disease)    Hypertension    Macular degeneration     bilateral   Irritable bladder    Rectal fissure    Enteritis    Duodenitis    Esophagitis    DJD (degenerative joint disease)    Dyspepsia    GERD (gastroesophageal reflux disease)    Past Surgical History  Procedure Laterality Date   Mole excision     Cataract surg     Laparotomy for treatment of adhession     Appendectomy     History reviewed. No pertinent family history. History   Social History   Marital Status: Widowed    Spouse Name: N/A    Number of Children: N/A   Years of Education: N/A   Occupational History   Not on file.   Social History Main Topics   Smoking status: Never Smoker    Smokeless tobacco: Never Used   Alcohol Use: No   Drug Use: No   Sexually Active: No   Other Topics Concern   Not on file   Social History Narrative   Lives  with [name]. I-ADLs. Widow x 43 years. She drinks daily coffee. ACP-PT is DNR/DNI; reviewed and completed MOST form: no intensive care, antibiotics after evaluation, short-term IVF, no tube feeding. Grand-Vaughan/POA present. (77 '12)       Objective:   Physical Exam  Constitutional: She is oriented to person, place, and time.  Cardiovascular: Normal heart sounds and intact distal pulses.   No murmur heard. Heart rate is irregularly irregular.  Pulmonary/Chest: Effort normal and breath sounds normal. No respiratory distress. She has no wheezes. She has no rales. She exhibits no tenderness.  Abdominal: Bowel sounds are normal. She  exhibits no distension and no mass. There is no tenderness. There is no rebound and no guarding.  Neurological: She is alert and oriented to person, place, and time. No cranial nerve deficit.   EKG revealed atrial fibrillation.    Assessment and Plan:     Atrial fibrillation: [name] will be admitted to [facility].  Cardiology will be consulted for treatment tomorrow (01/08/13).  Cardiac enzymes have been ordered.  Edema:  The etiology of the edema is unclear.  She will be given an EKG and a 2D echocardiogram to assess Amber cardiovascular system for possible etiologies.  We will assess Amber BUN and creatinine levels to evaluate Amber renal function.   Hypertension: Lasix and amlodipine will be continued for Amber hypertension.  She should follow up in six months to make sure care is optimal.   Home Healthcare: Pending developments over the course of Amber hospitalization, she will be referred to a home healthcare provider.

## 2013-01-07 NOTE — Progress Notes (Signed)
Pt. O2 sats 90% on RA. Pt. Refusing O2 via nasal cannula. RN will continue to monitor pt. For changes in condition Tushar Enns, Cheryll Dessert

## 2013-01-07 NOTE — H&P (Signed)
[name], female DOB: June 26, 1911, 79 y.o. MRN: [id number]  HPI Comments: [name] is a 79 year-old lady with a past medical history of hypertension and peripheral vascular disease who presents today due to ankle swelling of two weeks' duration. Her peripheral vascular disease is managed by Dr. Rose Fillers; her last visit was 07/05/12. She edema is normally managed with 40 mg lasix BID. Ten days ago, she changed to 60 mg BID on the advice of her daughter, which stabilized the swelling but did not reduce swelling. Prior to that, little swollen but could be remedied by propping feet up. Compression socks have not helped today and have not been worn other days (neither [name] nor her son can put them on her). No alleviating or exacerbating factors. She has lately been less mobile than usual since her son had a hernia surgery, so her mobility has been greatly restricted. She denies any dietary changes. She denies any SOB, cough, chest pain or racing heart. Denies any numbness or tingling. She does describe weakness of a few years duration.  Additionally, her granddaughter wants a prescription for skilled nurse to check in on her to monitor her health every week or so. [facility] [phone]. She needs to be diagnosed as homebound due to her macular degeneration, inability to drive, and that she can't walk indoors without assistance.   Review of Systems  Constitutional: Positive for activity change and unexpected weight change. Negative for fever, chills, diaphoresis and fatigue.  9 pound weight change, probably largely due to fluid weight.  HENT: Positive for neck pain. Negative for facial swelling.  Respiratory: Negative for choking, chest tightness, shortness of breath and stridor.  Cardiovascular: Positive for leg swelling. Negative for chest pain and palpitations.  Gastrointestinal: Positive for constipation. Negative for nausea, vomiting and diarrhea.  Endocrine: Negative for polydipsia  and polyuria.  Musculoskeletal: Positive for joint swelling. Negative for arthralgias.  Neurological: Positive for headaches. Negative for dizziness and light-headedness.  HA for last few days in front of head   Past Medical History   Diagnosis  Date   .  Peripheral vascular disease    .  Peripheral edema    .  COPD (chronic obstructive pulmonary disease)    .  Hypertension    .  Macular degeneration      bilateral   .  Irritable bladder    .  Rectal fissure    .  Enteritis    .  Duodenitis    .  Esophagitis    .  DJD (degenerative joint disease)    .  Dyspepsia    .  GERD (gastroesophageal reflux disease)     Past Surgical History   Procedure  Laterality  Date   .  Mole excision     .  Cataract surg     .  Laparotomy for treatment of adhession     .  Appendectomy      History reviewed. No pertinent family history.  History    Social History   .  Marital Status:  Widowed     Spouse Name:  N/A     Number of Children:  N/A   .  Years of Education:  N/A    Occupational History   .  Not on file.    Social History Main Topics   .  Smoking status:  Never Smoker   .  Smokeless tobacco:  Never Used   .  Alcohol Use:  No   .  Drug Use:  No   .  Sexually Active:  No    Other Topics  Concern   .  Not on file    Social History Narrative    Lives with her son. I-ADLs. Widow x 43 years. She drinks daily coffee. ACP-PT is DNR/DNI; reviewed and completed MOST form: no intensive care, antibiotics after evaluation, short-term IVF, no tube feeding. Grand-daughter/POA present. (Oct '12)     Objective:   Physical Exam  Constitutional: She is oriented to person, place, and time.  Cardiovascular: Normal heart sounds and intact distal pulses.  No murmur heard. Heart rate is irregularly irregular.  Pulmonary/Chest: Effort normal and breath sounds normal. No respiratory distress. She has no wheezes. She has no rales. She exhibits no tenderness.  Abdominal: Bowel sounds are normal.  She exhibits no distension and no mass. There is no tenderness. There is no rebound and no guarding.  Neurological: She is alert and oriented to person, place, and time. No cranial nerve deficit.   EKG revealed atrial fibrillation.   Assessment and Plan:    1. Atrial fibrillation: New on-set with presentation of weakness and increased peripheral edema.  Plan  [name] will be admitted to [facility].   Cardiac enzymes have been ordered.   2. Edema: Suspect acute combined systolic/diastolic failure secondary to a. Fib. Plan She will be given an EKG and a 2D echocardiogram to assess her cardiovascular system for possible etiologies.  We will assess her BUN and creatinine levels to evaluate her renal function.   Continue home lasix dose - 40 mg bid  3. Hypertension: BP stable at admission. Plan Lasix home dose  amlodipine will be stopped.   4. Code status - limited code: no CPR, no intubation.  Dispo  Home Healthcare: Pending developments over the course of her hospitalization, she will be referred to a home healthcare provider.

## 2013-01-08 DIAGNOSIS — I5021 Acute systolic (congestive) heart failure: Secondary | ICD-10-CM

## 2013-01-08 DIAGNOSIS — I4891 Unspecified atrial fibrillation: Principal | ICD-10-CM

## 2013-01-08 DIAGNOSIS — I359 Nonrheumatic aortic valve disorder, unspecified: Secondary | ICD-10-CM

## 2013-01-08 DIAGNOSIS — R609 Edema, unspecified: Secondary | ICD-10-CM

## 2013-01-08 DIAGNOSIS — I509 Heart failure, unspecified: Secondary | ICD-10-CM

## 2013-01-08 DIAGNOSIS — I369 Nonrheumatic tricuspid valve disorder, unspecified: Secondary | ICD-10-CM

## 2013-01-08 DIAGNOSIS — N289 Disorder of kidney and ureter, unspecified: Secondary | ICD-10-CM

## 2013-01-08 DIAGNOSIS — H3530 Unspecified macular degeneration: Secondary | ICD-10-CM

## 2013-01-08 LAB — BASIC METABOLIC PANEL
BUN: 18 mg/dL (ref 6–23)
Calcium: 9 mg/dL (ref 8.4–10.5)
GFR calc Af Amer: 33 mL/min — ABNORMAL LOW (ref >90)
GFR calc non Af Amer: 28 mL/min — ABNORMAL LOW (ref >90)
Glucose, Bld: 114 mg/dL — ABNORMAL HIGH (ref 70–99)
Sodium: 141 mEq/L (ref 135–145)

## 2013-01-08 MED ORDER — SODIUM CHLORIDE 0.45 % IV SOLN
INTRAVENOUS | Status: DC
  Administered 2013-01-08 – 2013-01-12 (×4): via INTRAVENOUS

## 2013-01-08 MED ORDER — DILTIAZEM HCL ER COATED BEADS 180 MG PO CP24
180.0000 mg | ORAL_CAPSULE | Freq: Every day | ORAL | Status: DC
  Administered 2013-01-08 – 2013-01-09 (×2): 180 mg via ORAL
  Filled 2013-01-08 (×3): qty 1

## 2013-01-08 NOTE — Progress Notes (Signed)
Pt. C/o lower leg pain this am. Scheduled tylenol given. [name], Cheryll Dessert

## 2013-01-08 NOTE — Progress Notes (Signed)
Pt a/o, pt has macular degeneration, no c/o pain, pt ambulated in hallway about 53ft while on 2L O2 pt did has come SOB while ambulated and stated she felt like she was getting weak, pt in chair with legs elevated, pt still has BLE edema noted, family at bedside, pt stable, will continue to monitor

## 2013-01-08 NOTE — Progress Notes (Signed)
Pt. Alert and oriented. No distress or discomfort noted. Pt. Denies pain. Grand-daughter at the bedside. Pt. With labs to be drawn every 6 hours. Lab Tech in to draw pts. Troponins.RN will continue to monitor pt. For changes in condition. Landan Fedie, Cheryll Dessert

## 2013-01-08 NOTE — Evaluation (Signed)
Physical Therapy Evaluation Patient Details Name: [name] MRN: [id number] DOB: 07/01/1911 Today's Date: 01/08/2013 Time: 4401-0272 PT Time Calculation (min): 25 min  PT Assessment / Plan / Recommendation History of Present Illness  79 year-old lady with a past medical history of hypertension and peripheral vascular disease who presents today due to ankle swelling of two weeks' duration. Her peripheral vascular disease is managed by Dr. Rose Fillers; her last visit was 07/05/12. She edema is normally managed with 40 mg lasix BID. Ten days ago, she changed to 60 mg BID on the advice of her daughter, which stabilized the swelling but did not reduce swelling.  Clinical Impression  Pt functioning near baseline. Pt to benefit from HHPT to maximize functional recovery to decrease falls risk due to pt refusal to use RW and noted unsteady gait.    PT Assessment  Patient needs continued PT services    Follow Up Recommendations  Home health PT;Supervision/Assistance - 24 hour    Does the patient have the potential to tolerate intense rehabilitation      Barriers to Discharge        Equipment Recommendations   (pt refuses to use RW)    Recommendations for Other Services     Frequency Min 3X/week    Precautions / Restrictions Precautions Precautions: Fall Restrictions Weight Bearing Restrictions: No   Pertinent Vitals/Pain Denies pain       Mobility  Bed Mobility Bed Mobility: Supine to Sit Supine to Sit: 4: Min assist;HOB elevated Details for Bed Mobility Assistance: increased time Transfers Transfers: Sit to Stand;Stand to Sit Sit to Stand: 4: Min guard;Without upper extremity assist;From bed;From toilet Stand to Sit: 4: Min guard;With upper extremity assist;To chair/3-in-1;To toilet Details for Transfer Assistance: v/c's for safe hand placement Ambulation/Gait Ambulation/Gait Assistance: 4: Min assist (via HHA) Ambulation Distance (Feet): 30 Feet Assistive device: 1  person hand held assist Ambulation/Gait Assistance Details: pt refused to use RW. pt completes furniture walking Gait Pattern: Step-through pattern;Decreased stride length Gait velocity: slow General Gait Details: pt mildly unsteady but granddaughter reports this to be her baseline. Pt SpO2 at 82% s/p amb to/from bathroom 2LO2 via [place] donned. s/p 5 min pt SpO2 at 94%. RN notified    Exercises     PT Diagnosis: Generalized weakness  PT Problem List: Decreased activity tolerance;Decreased mobility;Decreased strength;Decreased balance PT Treatment Interventions: Gait training;Functional mobility training;Therapeutic activities;Therapeutic exercise     PT Goals(Current goals can be found in the care plan section) Acute Rehab PT Goals Patient Stated Goal: home PT Goal Formulation: With patient/family Time For Goal Achievement: 01/15/13 Potential to Achieve Goals: Good  Visit Information  Last PT Received On: 01/08/13 Assistance Needed: +1 History of Present Illness: 79 year-old lady with a past medical history of hypertension and peripheral vascular disease who presents today due to ankle swelling of two weeks' duration. Her peripheral vascular disease is managed by Dr. Rose Fillers; her last visit was 07/05/12. She edema is normally managed with 40 mg lasix BID. Ten days ago, she changed to 60 mg BID on the advice of her daughter, which stabilized the swelling but did not reduce swelling.       Prior Functioning  Home Living Family/patient expects to be discharged to:: Private residence Living Arrangements: Children Available Help at Discharge: Family;Available 24 hours/day Type of Home: House Home Access: Stairs to enter [employer] of Steps: 3-4 Entrance Stairs-Rails: Can reach both Home Layout: One level Home Equipment: None Additional Comments: pt is home alone if  son has to run errands Prior Function Level of Independence: Needs assistance Gait / Transfers Assistance  Needed: pt refuses to use RW. granddaughter reports pt to furniture walk. pt will use walking stick outside ADL's / Homemaking Assistance Needed: pt does sponge baths, son cooks Communication / Swallowing Assistance Needed: no Communication Communication: No difficulties Dominant Hand: Right    Cognition  Cognition Arousal/Alertness: Awake/alert Behavior During Therapy: WFL for tasks assessed/performed Overall Cognitive Status: Within Functional Limits for tasks assessed    Extremity/Trunk Assessment Upper Extremity Assessment Upper Extremity Assessment: Overall WFL for tasks assessed Lower Extremity Assessment Lower Extremity Assessment: Generalized weakness (noted bilat LE edema) Cervical / Trunk Assessment Cervical / Trunk Assessment: Normal   Balance Balance Balance Assessed: Yes Static Standing Balance Static Standing - Balance Support: No upper extremity supported Static Standing - Level of Assistance: 5: Stand by assistance Static Standing - Comment/# of Minutes: pt washed hands at sink without problems  End of Session PT - End of Session Equipment Utilized During Treatment: Gait belt Activity Tolerance: Patient tolerated treatment well Patient left: in chair;with call bell/phone within reach;with family/visitor present Nurse Communication: Mobility status (O2 sats)  GP     Marcene Brawn 01/08/2013, 1:17 PM  Lewis Shock, PT, DPT Pager #: [phone] Office #: [phone]

## 2013-01-08 NOTE — Progress Notes (Signed)
Echo Lab  2D Echocardiogram completed.  [name] L [name], RDCS 01/08/2013 11:02 AM

## 2013-01-08 NOTE — Progress Notes (Signed)
Patient ID: ZARIA TAHA, female   DOB: 1911/09/27, 79 y.o.   MRN: [id number] Subjective: Ms. Romagnoli was feeling better this morning.  Her feet hurt less than yesterday.  She denied any overnight SOB or cough.  Her granddaughter requests that the order for Vicodin be discontinued given her history of delirium after being given it at a prior visit.    Objective: Lab: No results found for this basename: WBC, NEUTROABS, HGB, HCT, MCV, PLT,  in the last 72 hours  Recent Labs  01/07/13 1932  NA 140  K 3.0*  CL 97  GLUCOSE 109*  BUN 19  CREATININE 1.44*  CALCIUM 9.2  MG 2.1   BNP 2131 Cardiac Panel (last 3 results)  Recent Labs  01/07/13 1932 01/08/13 0130  TROPONINI <0.30 <0.30    Imaging: IMPRESSION:  1. Limited examination demonstrating atelectasis and/or  consolidation in the left lower lobe with superimposed small left  pleural effusion.  2. Atherosclerosis.  3. Pulmonary venous congestion, without frank pulmonary edema.   Scheduled Meds: . acetaminophen  650 mg Oral BID  . aspirin  81 mg Oral Daily  . cilostazol  100 mg Oral BID  . enoxaparin (LOVENOX) injection  30 mg Subcutaneous Q24H  . furosemide  40 mg Oral BID  . nebivolol  2.5 mg Oral Daily  . oxybutynin  5 mg Oral Daily  . senna  1 tablet Oral BID  . sodium chloride  3 mL Intravenous Q12H   Continuous Infusions: . sodium chloride    . diltiazem (CARDIZEM) infusion 5 mg/hr (01/07/13 1858)   PRN Meds:.HYDROcodone-acetaminophen   Physical Exam: Filed Vitals:   01/08/13 0654  BP: 139/52  Pulse: 110  Temp: 98.2 F (36.8 C)  Resp: 20   EKG this morning indicates that atrial fibrillation persists. Gen'l - Elderly white woman in no distress. Very poor vision HEENT- [place]/AT Cor - 2+ radial pulse, heart sounds very distant and hard to appreciate. Tele with A. Fib PUlm - decreased at the bases, no rales, no wheezes Abd- soft Neuro - A&O x 3   Assessment/Plan: 1. Atrial fibrillation: The EKG this  morning confirms the persistence of atrial fibrillation.  She has better rate control on IV diltiazem. Cardiac enzymes are negative.  echocardiogram pendng.  Plan Convert to oral diltiazem  For persistent a. Fib will add Amiodarone 200 mg    2. CHF- by x-ray patient with pulmonary edema.  BNP was elevated. She is in less distress with some improvement in peripheral edema.  Plan 2D echo is pending  Continue PO lasix  Follow BNP  3. HTN     BP Readings from Last 3 Encounters:  01/08/13 139/52  01/07/13 134/60  07/05/12 155/54   Stable.   4. Renal - mild acute renal insufficiency. Unlikely to have nephrotic syndrome  PLan F/u BMet  Watch fluid balance carefully.  Dispo - home when stable  [name]  01/08/2013, 7:54 AM

## 2013-01-09 ENCOUNTER — Inpatient Hospital Stay (HOSPITAL_COMMUNITY): Payer: Medicare Other

## 2013-01-09 ENCOUNTER — Encounter (HOSPITAL_COMMUNITY): Payer: Self-pay | Admitting: *Deleted

## 2013-01-09 LAB — BASIC METABOLIC PANEL
BUN: 18 mg/dL (ref 6–23)
Creatinine, Ser: 1.48 mg/dL — ABNORMAL HIGH (ref 0.50–1.10)
GFR calc Af Amer: 32 mL/min — ABNORMAL LOW (ref >90)
GFR calc non Af Amer: 28 mL/min — ABNORMAL LOW (ref >90)

## 2013-01-09 MED ORDER — AMIODARONE HCL 200 MG PO TABS
200.0000 mg | ORAL_TABLET | Freq: Every day | ORAL | Status: DC
  Administered 2013-01-09 – 2013-01-16 (×8): 200 mg via ORAL
  Filled 2013-01-09 (×8): qty 1

## 2013-01-09 MED ORDER — FUROSEMIDE 10 MG/ML IJ SOLN
40.0000 mg | Freq: Four times a day (QID) | INTRAMUSCULAR | Status: AC
  Administered 2013-01-09 – 2013-01-10 (×4): 40 mg via INTRAVENOUS
  Filled 2013-01-09 (×2): qty 4

## 2013-01-09 MED ORDER — POTASSIUM CHLORIDE CRYS ER 20 MEQ PO TBCR
20.0000 meq | EXTENDED_RELEASE_TABLET | Freq: Three times a day (TID) | ORAL | Status: AC
  Administered 2013-01-09 (×3): 20 meq via ORAL
  Filled 2013-01-09: qty 1
  Filled 2013-01-09: qty 2

## 2013-01-09 NOTE — Progress Notes (Signed)
Patient ID: [name], female   DOB: Apr 14, 1912, 79 y.o.   MRN: [id number] Subjective: Ms. [name] is a 79 year-old lady here on hospital day 2.  She states that been feeling slightly better.  She describes a little trouble breathing and new throat pain.  Objective: Lab: No results found for this basename: WBC, NEUTROABS, HGB, HCT, MCV, PLT,  in the last 72 hours  Recent Labs  01/07/13 1932 01/08/13 0740 01/09/13 0505  NA 140 141 140  K 3.0* 3.1* 3.2*  CL 97 99 100  GLUCOSE 109* 114* 106*  BUN 19 18 18   CREATININE 1.44* 1.47* 1.48*  CALCIUM 9.2 9.0 8.4  MG 2.1  --   --    BNP 1751 Cardiac Panel (last 3 results)  Recent Labs  01/07/13 1932 01/08/13 0130 01/08/13 0740  TROPONINI <0.30 <0.30 <0.30    Imaging: IMPRESSION:  1. Limited examination demonstrating atelectasis and/or  consolidation in the left lower lobe with superimposed small left  pleural effusion.  2. Atherosclerosis.  3. Pulmonary venous congestion, without frank pulmonary edema.   Scheduled Meds: . acetaminophen  650 mg Oral BID  . aspirin  81 mg Oral Daily  . cilostazol  100 mg Oral BID  . diltiazem  180 mg Oral Daily  . enoxaparin (LOVENOX) injection  30 mg Subcutaneous Q24H  . furosemide  40 mg Oral BID  . nebivolol  2.5 mg Oral Daily  . oxybutynin  5 mg Oral Daily  . senna  1 tablet Oral BID  . sodium chloride  3 mL Intravenous Q12H   Continuous Infusions: . sodium chloride 10 mL/hr at 01/08/13 1900   PRN Meds:.  Physical Exam: Filed Vitals:   01/09/13 0622  BP: 136/55  Pulse: 80  Temp: 98.2 F (36.8 C)  Resp: 20   Gen'l - Elderly white woman in NAD. Very poor vision HEENT- [place]/AT Cor - 2+ radial pulse, heart sounds distant and hard to appreciate. Tele with A. Fib Pulm - Increased WOB (neck retraction and use of abdominal musculature for breathing.) Breath sounds are decreased at the bases, no rales, no wheezes Abd- soft Extremities - 2+ bilateral pitting edema in feet and  ankles Neuro - A&O x 3  EKG this morning indicates that atrial fibrillation still persists.  Cardiac ultrasound: IMPRESSION: - Left ventricle: The cavity size was normal. Wall thickness  was increased in a pattern of mild LVH. Systolic function was normal. The estimated ejection fraction was in the range of 60% to 65%. Wall motion was normal; there were no regional wall motion abnormalities. Doppler parameters are consistent with high ventricular filling pressure. - Aortic valve: There was very mild stenosis. Mild regurgitation. Valve area: 1.57cm^2(VTI). Valve area: 1.81cm^2 (Vmax). - Mitral valve: Calcified annulus. Mild regurgitation. - Left atrium: The atrium was mildly dilated. - Right atrium: The atrium was moderately dilated. - Tricuspid valve: Moderate regurgitation. - Pulmonary arteries: PA peak pressure: 44mm Hg (S). Transthoracic echocardiography. M-mode, complete 2D, spectral Doppler, and color Doppler. Height: Height: 149.9cm. Height: 59in. Weight: Weight: 61.2kg. Weight: 134.6lb. Body mass index: BMI: 27.2kg/m^2. Body surface area: BSA: 1.29m^2. Blood pressure: 139/52. Patient status: Inpatient. Location: Bedside.  Assessment/Plan: 1. Atrial fibrillation: EKG indicates persistent AFib.  Rate control is satisfactory on oral diltiazem.   Plan Continue oral diltiazem  Amiodarone 200 mg for persistent AFib   2. CHF- by x-ray patient with pulmonary edema.  BNP was elevated. She appears in moderate respiratory distress.  She has had some improvement  in peripheral edema.  Ultrasound indicates diastolic dysfunction.  Plan Switch to IV lasix, 40 mg QID  Follow-up 2-view CXR  3. HTN     BP Readings from Last 3 Encounters:  01/09/13 136/55  01/07/13 134/60  07/05/12 155/54   Stable.   4. Renal - mild acute renal insufficiency. Unlikely to have nephrotic syndrome  Plan F/u BMet  Continue to monitor fluid balance.  Dispo - home when stable  [name]  01/09/2013, 7:19 AM

## 2013-01-09 NOTE — Progress Notes (Signed)
Utilization Review Completed.   Capitola Ladson, RN, BSN Nurse Case Manager  [phone]  

## 2013-01-09 NOTE — Progress Notes (Signed)
Physical Therapy Treatment Patient Details Name: [name] MRN: [id number] DOB: Jan 23, 1912 Today's Date: 01/09/2013 Time: 0940-1009 PT Time Calculation (min): 29 min  PT Assessment / Plan / Recommendation  History of Present Illness 79 year-old lady with a past medical history of hypertension and peripheral vascular disease who presents today due to ankle swelling of two weeks' duration. Her peripheral vascular disease is managed by Dr. Rose Fillers; her last visit was 07/05/12. She edema is normally managed with 40 mg lasix BID. Ten days ago, she changed to 60 mg BID on the advice of her daughter, which stabilized the swelling but did not reduce swelling.   PT Comments   Pt very pleasant & willing to participate in therapy.   Pt agreeable to ambulate with RW this session however this clinician feels that with pt's age & vision deficits would not recommend use of RW at d/c at this time.    Follow Up Recommendations  Home health PT;Supervision/Assistance - 24 hour     Does the patient have the potential to tolerate intense rehabilitation     Barriers to Discharge        Equipment Recommendations       Recommendations for Other Services    Frequency Min 3X/week   Progress towards PT Goals Progress towards PT goals: Progressing toward goals  Plan Current plan remains appropriate    Precautions / Restrictions Precautions Precautions: Fall Restrictions Weight Bearing Restrictions: No   Pertinent Vitals/Pain 02 sats dropped to 70's RA with ambulation.  2L 02 via [place] reapplied after returning to room & sats increased back to 90's.  RN made aware.      Mobility  Bed Mobility Bed Mobility: Supine to Sit;Sitting - Scoot to Edge of Bed Supine to Sit: 4: Min guard;With rails Sitting - Scoot to Edge of Bed: 5: Supervision Details for Bed Mobility Assistance: increased time Transfers Transfers: Sit to Stand;Stand to Sit Sit to Stand: 4: Min guard;With upper extremity assist;From  bed Stand to Sit: 4: Min guard;With upper extremity assist;To bed;To chair/3-in-1 Details for Transfer Assistance: cues for hand placement  Ambulation/Gait Ambulation/Gait Assistance: 4: Min guard Ambulation Distance (Feet): 80 Feet Assistive device: Rolling walker Ambulation/Gait Assistance Details: Pt agreeable to trial ambulation with use of RW today.  Fairly steady but required cues for safe use of RW & safe manuevering of RW around obstacles.  At this time feel RW may be more hindering due to pt's age & vision deficits therefore do not recommend use of RW at d/c Gait Pattern: Step-through pattern Gait velocity: slow General Gait Details: Ambulated without supplemental 02 with 02 sats dropping to 70's but quickly recovered with reapplying 2L via [place] when returned to room.   Stairs: No Wheelchair Mobility Wheelchair Mobility: No      PT Goals (current goals can now be found in the care plan section) Acute Rehab PT Goals Patient Stated Goal: home PT Goal Formulation: With patient/family Time For Goal Achievement: 01/15/13 Potential to Achieve Goals: Good  Visit Information  Last PT Received On: 01/09/13 Assistance Needed: +1 History of Present Illness: 79 year-old lady with a past medical history of hypertension and peripheral vascular disease who presents today due to ankle swelling of two weeks' duration. Her peripheral vascular disease is managed by Dr. Rose Fillers; her last visit was 07/05/12. She edema is normally managed with 40 mg lasix BID. Ten days ago, she changed to 60 mg BID on the advice of her daughter, which stabilized the swelling but  did not reduce swelling.    Subjective Data  Patient Stated Goal: home   Cognition  Cognition Arousal/Alertness: Awake/alert Behavior During Therapy: WFL for tasks assessed/performed Overall Cognitive Status: Within Functional Limits for tasks assessed    Balance     End of Session PT - End of Session Equipment Utilized During  Treatment: Gait belt Activity Tolerance: Patient limited by fatigue;Other (comment) (decreased 02 sats) Patient left: in chair;with call bell/phone within reach;with family/visitor present Nurse Communication: Mobility status   GP     Lara Mulch 01/09/2013, 1:11 PM   Verdell Face, PTA [phone] 01/09/2013

## 2013-01-10 DIAGNOSIS — I5032 Chronic diastolic (congestive) heart failure: Secondary | ICD-10-CM

## 2013-01-10 DIAGNOSIS — I10 Essential (primary) hypertension: Secondary | ICD-10-CM

## 2013-01-10 LAB — LACTATE DEHYDROGENASE, ISOENZYMES
LDH 1: 27 % (ref 19–38)
LDH 4: 10 % (ref 3–12)
LDH 5: 10 % (ref 3–14)
LDH Isoenzymes, Total: 185 U/L (ref 120–250)

## 2013-01-10 LAB — BASIC METABOLIC PANEL
BUN: 19 mg/dL (ref 6–23)
Calcium: 8.3 mg/dL — ABNORMAL LOW (ref 8.4–10.5)
Creatinine, Ser: 1.47 mg/dL — ABNORMAL HIGH (ref 0.50–1.10)
GFR calc Af Amer: 33 mL/min — ABNORMAL LOW (ref >90)
GFR calc non Af Amer: 28 mL/min — ABNORMAL LOW (ref >90)

## 2013-01-10 MED ORDER — ENSURE COMPLETE PO LIQD
237.0000 mL | Freq: Three times a day (TID) | ORAL | Status: DC
  Administered 2013-01-10 – 2013-01-16 (×11): 237 mL via ORAL

## 2013-01-10 MED ORDER — FUROSEMIDE 10 MG/ML IJ SOLN
80.0000 mg | Freq: Two times a day (BID) | INTRAMUSCULAR | Status: DC
  Administered 2013-01-10 – 2013-01-14 (×8): 80 mg via INTRAVENOUS
  Filled 2013-01-10 (×10): qty 8

## 2013-01-10 MED ORDER — POTASSIUM CHLORIDE CRYS ER 20 MEQ PO TBCR
20.0000 meq | EXTENDED_RELEASE_TABLET | Freq: Three times a day (TID) | ORAL | Status: AC
  Administered 2013-01-10 (×3): 20 meq via ORAL
  Filled 2013-01-10 (×3): qty 1

## 2013-01-10 MED ORDER — DILTIAZEM HCL ER COATED BEADS 240 MG PO CP24
240.0000 mg | ORAL_CAPSULE | Freq: Every day | ORAL | Status: DC
  Administered 2013-01-10 – 2013-01-16 (×7): 240 mg via ORAL
  Filled 2013-01-10 (×8): qty 1

## 2013-01-10 MED ORDER — FUROSEMIDE 10 MG/ML IJ SOLN
40.0000 mg | Freq: Four times a day (QID) | INTRAMUSCULAR | Status: DC
  Administered 2013-01-10: 40 mg via INTRAVENOUS
  Filled 2013-01-10: qty 4

## 2013-01-10 MED ORDER — METOPROLOL TARTRATE 25 MG PO TABS
25.0000 mg | ORAL_TABLET | Freq: Two times a day (BID) | ORAL | Status: DC
  Administered 2013-01-10 – 2013-01-11 (×3): 25 mg via ORAL
  Filled 2013-01-10 (×4): qty 1

## 2013-01-10 NOTE — Consult Note (Signed)
CARDIOLOGY CONSULT NOTE  Patient ID: [name] MRN: [id number], DOB/AGE: 1912/05/16   Admit date: 01/07/2013 Date of Consult: 01/10/2013   Primary Physician: Illene Regulus, MD Primary Cardiologist: C. Clifton James, MD   [name], female DOB: [birth year], 79 y.o.   Pt. Profile      79 y/o female w/o h/o PVD and htn who was admitted 8/11 with dyspnea/chf and new onset afib.  HPI Comments: [name] is a 79 year-old lady with a past medical history of hypertension, diverticular bleed 2012, claudication (Last ABI in February 2012 0.49 on the right and 0.51 on the left) and peripheral vascular disease who presented Monday, 01/07/13,  due to acute on chronic ankle swelling of two weeks' duration. She has had recent weight gain, leg swelling and bumping Lasix to 60mg  BID has not brought the swelling down, though it has stabilized her situation. She denies SOB, cough, chest pain or racing heart. She is homebound due to macular degeneration and can't walk indoors without assistance. She is seen today for her A fib and Congestive Heart Failure. The A fib is recent onset most likely occuring with the resent fluid overload.  Her BNP is ~1700.  EF is 60-65%. . Tele depicts A fib with HR fluctuating from 105-120 . Renal function is poor with Cr at 1.47 and GFR 33-28. She currently has no symptoms or complaints, however admits that her throat is sore.    Problem List  Past Medical History  Diagnosis Date  . Peripheral vascular disease   . Peripheral edema   . COPD (chronic obstructive pulmonary disease)   . Hypertension   . Macular degeneration     bilateral  . Irritable bladder   . Rectal fissure   . Enteritis   . Duodenitis   . Esophagitis   . DJD (degenerative joint disease)   . Dyspepsia   . GERD (gastroesophageal reflux disease)     Past Surgical History  Procedure Laterality Date  . Mole excision    . Cataract surg    . Laparotomy for treatment of adhession    . Appendectomy         Allergies  Allergies  Allergen Reactions  . Codone [Hydrocodone]     hallucinations  . Codeine Other (See Comments)    Hallucinations/abnormal behavior Per family member pt can not tolerate strong pain medications   Inpatient Medications  . acetaminophen  650 mg Oral BID  . amiodarone  200 mg Oral Daily  . aspirin  81 mg Oral Daily  . cilostazol  100 mg Oral BID  . diltiazem  240 mg Oral Daily  . enoxaparin (LOVENOX) injection  30 mg Subcutaneous Q24H  . feeding supplement  237 mL Oral TID BM  . furosemide  40 mg Intravenous Q6H  . nebivolol  2.5 mg Oral Daily  . oxybutynin  5 mg Oral Daily  . potassium chloride  20 mEq Oral TID  . senna  1 tablet Oral BID  . sodium chloride  3 mL Intravenous Q12H    Family History History reviewed. No pertinent family history.   Social History History   Social History  . Marital Status: Widowed    Spouse Name: N/A    Number of Children: N/A  . Years of Education: N/A   Occupational History  . Not on file.   Social History Main Topics  . Smoking status: Never Smoker   . Smokeless tobacco: Never Used  . Alcohol Use: No  .  Drug Use: No  . Sexual Activity: No   Other Topics Concern  . Not on file   Social History Narrative   Lives with her son. I-ADLs. Widow x 43 years. She drinks daily coffee. ACP-PT is DNR/DNI; reviewed and completed MOST form: no intensive care, antibiotics after evaluation, short-term IVF, no tube feeding. Grand-daughter/POA present. (Oct '12)     Review of Systems  General:  No chills, fever, night sweats or weight changes.  Cardiovascular:  No chest pain, dyspnea on exertion, edema, orthopnea, palpitations, paroxysmal nocturnal dyspnea. Dermatological: No rash, lesions/masses Respiratory: No cough, dyspnea Urologic: No hematuria, dysuria Abdominal:   No nausea, vomiting, diarrhea, bright red blood per rectum, melena, or hematemesis Neurologic:  No visual changes, wkns, changes in mental  status. All other systems reviewed and are otherwise negative except as noted above.  Physical Exam  Blood pressure 152/60, pulse 99, temperature 98.2 F (36.8 C), temperature source Oral, resp. rate 20, height 4\' 11"  (1.499 m), weight 61.961 kg (136 lb 9.6 oz), SpO2 94.00%.  General: Pleasant, NAD Psych: Normal affect. Neuro: Alert and oriented X 3. Moves all extremities spontaneously. HEENT: legally blind  Neck: Supple without bruits. JVD 12cm Lungs:  Resp regular with crackles at lung bases. No wheezing. Heart: irregular heartbeat with no s3, s4, or murmurs. Abdomen: Soft, non-tender, non-distended, BS + x 4.  Extremities: No clubbing, cyanosis. Patient has strong pulses at all extremities and pitting edema at lower legs. DP/PT/Radials 2+ and equal bilaterally.  Labs   Recent Labs  01/07/13 1932 01/08/13 0130 01/08/13 0740  TROPONINI <0.30 <0.30 <0.30   Lab Results  Component Value Date   WBC 8.4 04/11/2012   HGB 14.5 04/11/2012   HCT 43.2 04/11/2012   MCV 97.9 04/11/2012   PLT 225.0 04/11/2012    Recent Labs Lab 01/07/13 1932  01/10/13 0536  NA 140  < > 139  K 3.0*  < > 3.6  CL 97  < > 99  CO2 30  < > 30  BUN 19  < > 19  CREATININE 1.44*  < > 1.47*  CALCIUM 9.2  < > 8.3*  PROT 6.7  --   --   BILITOT 0.3  --   --   ALKPHOS 64  --   --   ALT 10  --   --   AST 17  --   --   GLUCOSE 109*  < > 134*  < > = values in this interval not displayed. Lab Results  Component Value Date   CHOL 179 03/14/2007   HDL 36.2* 03/14/2007   TRIG 337* 03/14/2007   No results found for this basename: DDIMER    Radiology/Studies  Dg Chest 2 View  01/09/2013   *RADIOLOGY REPORT*  Clinical Data: Increased work of breathing  CHEST - 2 VIEW  Comparison: Chest radiograph 01/07/2013  Findings: Stable enlarged heart silhouette.  There is interval increase in interstitial linear markings compared to prior suggesting interstitial edema.  There is underlying chronic bronchitic change  which is similar to prior.  Bilateral small pleural effusions present.  There is scarring at the lung apices.  IMPRESSION:  1.  Interval increase in interstitial edema pattern. 2.  Small pleural effusions. 3.  Underlying chronic bronchitic change and scarring.   Original Report Authenticated By: Genevive Bi, M.D.   Portable Chest 1 View  01/07/2013   *RADIOLOGY REPORT*  Clinical Data: Heart failure.  Atrial fibrillation.  PORTABLE CHEST - 1 VIEW  Comparison:  No priors.  Findings: The patient is severely rotated to the left resulting in gross distortion of the cardiomediastinal structures.  With these limitations in mind, there is a retrocardiac opacity in the base of the left hemithorax, concern for atelectasis and/or consolidation in the left lower lobe, likely with a small superimposed left pleural effusion.  Mild cephalization of the pulmonary vasculature, without frank pulmonary edema.  Heart size appears borderline to mildly enlarged.  Atherosclerosis of the thoracic aorta.  IMPRESSION: 1.  Limited examination demonstrating atelectasis and/or consolidation in the left lower lobe with superimposed small left pleural effusion. 2.  Atherosclerosis. 3.  Pulmonary venous congestion, without frank pulmonary edema.   Original Report Authenticated By: Trudie Reed, M.D.    ECG  Impression of ECG on 01/08/13: Atrial fibrillation with low voltage QRS. Nonspecific T wave abnormality , probably digitalis effect. Abnormal ECG  2D Echocardiogram  01/08/2013 Study Conclusions  - Left ventricle: The cavity size was normal. Wall thickness was increased in a pattern of mild LVH. Systolic function was normal. The estimated ejection fraction was in the range of 50% to 55%. Wall motion was normal; there were no regional wall motion abnormalities. Features are consistent with a pseudonormal left ventricular filling pattern, with concomitant abnormal relaxation and increased filling pressure (grade 2  diastolic dysfunction). - Right atrium: The atrium was mildly dilated. - Atrial septum: No defect or patent foramen ovale was identified.   ASSESSMENT AND PLAN  1.  Acute Diastolic CHF:  In setting of new onset afib.  Echo 8/12 showed nl LV fxn and Gr 2 DD.  She has been receiving IV lasix and despite this, weight hasn't really moved (sl up) and I/O's have been positive.  Will switch lasix to 80 IV bid instead of 40 qid.  Renal fxn show mild insufficiency but has been stable since admission.  HR's have been generally controlled - 80's to 110's, but could be better.   BP's have been variable though higher today. Will switch bystolic to metoprolol 25mg  bid and titrate for desired effect.  2.  New onset Afib:  Unknown duration.  CHA2DS2VASc = 6 however she is 89, frail, legally blind, and fell about 6 wks ago @ home.  We would not be inclined to initiate long term anticoagulation as risks likely outweigh benefits.  As a result, we will continue with rate as opposed to rhythm control.  Changing bb to lopressor as above.  Cont dilt and amio for now.  With change and titration of bb, we may be able to d/c amio.  Cont ASA 81.  3.  PVD:  No acute complaints.  4.  Bilat LEE:  This is somewhat chronic.  R calf is somewhat more swollen and tender than left.  Will check venous dopplers to r/o DVT.  Signed, SEALS, JAMIE, PA-S  01/10/2013, 10:59 AM  Seen and examined with Ms. Seals.  79 y/o female with h/o PVD who presented on 8/11 with dyspnea and new onset afib.  She has been somewhat slow to diurese and weight remains about 6 lbs above where it was in 06/2012 according to [facility] records.  Dyspnea has improved somewhat but she still has evidence of volume overload.  HR's have been reasonable but in setting of afib, likely need to be better.  Exam: AAOx3, nad, lungs with bibasilar crackles and diminished breath sounds, Cor ir, ir, 2/6 syst m @ llsb, neck veins to jaw, abd soft, nt/nd, bs+x4, Ext 1+ bilat LEE  with slight  r calf tenderness.  DP 1+ bilat.  See above for full assessment and plan as Ms. Seals and I developed this together.  Switch bystolic to metoprolol, consolidate lasix, and check LE U/S.  Nicolasa Ducking 2:17 PM 01/10/2013 Patient seen with Britt Bottom PA-S and with Nicolasa Ducking, NP.  She is a delightful elderly lady who worked at [employer] for many years.  She has presented now with increasing shortness of breath and has atrial fibrillation of unknown duration.  Physical examination is notable for jugular venous distention and she does have tricuspid regurgitation by echo.  Her lungs still reveal some bibasilar rales more on the right.  The heart reveals a systolic ejection murmur at the base.  The extremities showed that the right calf is larger than the left and is tender to palpation posteriorly.  We will get venous Dopplers of lower extremity to rule out DVT.  Agree with assessment and plan as noted above.  Metoprolol might give [place] better rate control than her previous bystolic.  Continue baby aspirin.  Would not use full anticoagulation in this frail patient.

## 2013-01-10 NOTE — Progress Notes (Signed)
Patient evaluated for community based chronic disease management services with [facility] Care Management Program as a benefit of patient's [employer].  Spoke with patient at bedside to explain [facility] Care Management services. Patient would like to have her son/POA input before deciding on any services.  Left contact information and [facility] literature at bedside.  Will collaborate with inpatient care management about how [facility] services can compliment her discharge plan.  Made inpatient Case Manager aware that [facility] Care Management following. Of note, [facility] Care Management services does not replace or interfere with any services that are arranged by inpatient case management or social work.  For additional questions or referrals please contact Anibal Henderson BSN RN [facility] Liaison at [phone].

## 2013-01-10 NOTE — Progress Notes (Signed)
Physical Therapy Treatment Patient Details Name: [name] MRN: [id number] DOB: Jun 29, 1911 Today's Date: 01/10/2013 Time: 4401-0272 PT Time Calculation (min): 28 min  PT Assessment / Plan / Recommendation  History of Present Illness 79 year-old lady with a past medical history of hypertension and peripheral vascular disease who presents today due to ankle swelling of two weeks' duration. Her peripheral vascular disease is managed by Dr. Rose Fillers; her last visit was 07/05/12. She edema is normally managed with 40 mg lasix BID. Ten days ago, she changed to 60 mg BID on the advice of her daughter, which stabilized the swelling but did not reduce swelling.   PT Comments   Pt appeared more weak today & fatigued very quickly.  Trialed ambulation without RW again today & she was only able to ambulate ~15' & required mod assist however still do not feel RW would be appropriate for pt due to her vision & age.  After ~15' pt became anxious & fearful of falling.     Follow Up Recommendations  Home health PT;Supervision/Assistance - 24 hour     Does the patient have the potential to tolerate intense rehabilitation     Barriers to Discharge        Equipment Recommendations       Recommendations for Other Services    Frequency Min 3X/week   Progress towards PT Goals    Plan Current plan remains appropriate    Precautions / Restrictions Precautions Precautions: Fall Restrictions Weight Bearing Restrictions: No   Pertinent Vitals/Pain No pain reported.  Sp02 >90% 3L 02.      Mobility  Bed Mobility Bed Mobility: Supine to Sit;Sitting - Scoot to Edge of Bed Supine to Sit: HOB flat;4: Min assist Sitting - Scoot to [place] of Bed: 5: Supervision Details for Bed Mobility Assistance: (A) to lift shoulders/trunk to sitting upright & increased time Transfers Transfers: Sit to Stand;Stand to Sit Sit to Stand: 4: Min assist;With upper extremity assist;From bed Stand to Sit: 4: Mod assist Details  for Transfer Assistance: (A) to achieve standing via HHA due to pt c/o LE weakness & mod assist to control descent to recliner Ambulation/Gait Ambulation/Gait Assistance: 3: Mod assist Ambulation Distance (Feet): 15 Feet Assistive device: None Ambulation/Gait Assistance Details: Attempted ambulation without RW today due to pt did not use at home however she required mod (A) for balance.   Gait Pattern: Step-through pattern;Decreased stride length;Shuffle Gait velocity: slow General Gait Details: Pt fatigued quickly & c/o LE weakness & appeared to become anxious & fearful of falling.   Stairs: No Wheelchair Mobility Wheelchair Mobility: No     PT Goals (current goals can now be found in the care plan section) Acute Rehab PT Goals Patient Stated Goal: home PT Goal Formulation: With patient/family Time For Goal Achievement: 01/15/13 Potential to Achieve Goals: Good  Visit Information  Last PT Received On: 01/10/13 Assistance Needed: +1 History of Present Illness: 79 year-old lady with a past medical history of hypertension and peripheral vascular disease who presents today due to ankle swelling of two weeks' duration. Her peripheral vascular disease is managed by Dr. Rose Fillers; her last visit was 07/05/12. She edema is normally managed with 40 mg lasix BID. Ten days ago, she changed to 60 mg BID on the advice of her daughter, which stabilized the swelling but did not reduce swelling.    Subjective Data  Patient Stated Goal: home   Cognition  Cognition Arousal/Alertness: Awake/alert Behavior During Therapy: WFL for tasks assessed/performed Overall Cognitive  Status: Within Functional Limits for tasks assessed    Balance     End of Session PT - End of Session Equipment Utilized During Treatment: Gait belt Activity Tolerance: Patient limited by fatigue Patient left: in chair;with call bell/phone within reach;with family/visitor present Nurse Communication: Mobility status   GP      Lara Mulch 01/10/2013, 11:24 AM  Verdell Face, PTA [phone] 01/10/2013

## 2013-01-10 NOTE — Progress Notes (Signed)
Patient ID: [name], female   DOB: Oct 11, 1911, 79 y.o.   MRN: [id number] Subjective: Ms. [name] is a 79 year-old lady here on hospital day 4.  Overnight her cough has persisted and she describes a difficult time breathing.  Objective: Lab: No results found for this basename: WBC, NEUTROABS, HGB, HCT, MCV, PLT,  in the last 72 hours  Recent Labs  01/07/13 1932 01/08/13 0740 01/09/13 0505 01/10/13 0536  NA 140 141 140 139  K 3.0* 3.1* 3.2* 3.6  CL 97 99 100 99  GLUCOSE 109* 114* 106* 134*  BUN 19 18 18 19   CREATININE 1.44* 1.47* 1.48* 1.47*  CALCIUM 9.2 9.0 8.4 8.3*  MG 2.1  --   --   --    BNP last result: 1751 (01/10/13)  Cardiac Panel (last 3 results)  Recent Labs  01/07/13 1932 01/08/13 0130 01/08/13 0740  TROPONINI <0.30 <0.30 <0.30    Imaging: IMPRESSION:  1. Interval increase in interstitial edema pattern.  2. Small pleural effusions.  3. Underlying chronic bronchitic change and scarring.  Scheduled Meds: . acetaminophen  650 mg Oral BID  . amiodarone  200 mg Oral Daily  . aspirin  81 mg Oral Daily  . cilostazol  100 mg Oral BID  . diltiazem  180 mg Oral Daily  . enoxaparin (LOVENOX) injection  30 mg Subcutaneous Q24H  . nebivolol  2.5 mg Oral Daily  . oxybutynin  5 mg Oral Daily  . senna  1 tablet Oral BID  . sodium chloride  3 mL Intravenous Q12H   Continuous Infusions: . sodium chloride 10 mL/hr at 01/08/13 1900   PRN Meds:.  Physical Exam: Filed Vitals:   01/10/13 0453  BP: 146/82  Pulse: 99  Temp: 98.2 F (36.8 C)  Resp: 20   Gen'l - Elderly white woman in moderate respiratory distress. Very poor vision HEENT- [place]/AT Cor - 2+ radial pulse, heart sounds distant and hard to appreciate. Tele with A. Fib Pulm - Increased WOB (neck retraction and use of abdominal musculature for breathing.) Breath sounds are decreased at the bases, no rales, no wheezes Abd- soft Extremities - 2+ bilateral pitting edema in feet and ankles Neuro - A&O x  3  EKG on 8/13 morning indicates that atrial fibrillation still persists.  Cardiac ultrasound: IMPRESSION: - Left ventricle: The cavity size was normal. Wall thickness  was increased in a pattern of mild LVH. Systolic function was normal. The estimated ejection fraction was in the range of 60% to 65%. Wall motion was normal; there were no regional wall motion abnormalities. Doppler parameters are consistent with high ventricular filling pressure. - Aortic valve: There was very mild stenosis. Mild regurgitation. Valve area: 1.57cm^2(VTI). Valve area: 1.81cm^2 (Vmax). - Mitral valve: Calcified annulus. Mild regurgitation. - Left atrium: The atrium was mildly dilated. - Right atrium: The atrium was moderately dilated. - Tricuspid valve: Moderate regurgitation. - Pulmonary arteries: PA peak pressure: 44mm Hg (S). Transthoracic echocardiography. M-mode, complete 2D, spectral Doppler, and color Doppler. Height: Height: 149.9cm. Height: 59in. Weight: Weight: 61.2kg. Weight: 134.6lb. Body mass index: BMI: 27.2kg/m^2. Body surface area: BSA: 1.50m^2. Blood pressure: 139/52. Patient status: Inpatient. Location: Bedside.  Assessment/Plan: 1. Atrial fibrillation: Telemetry indicates persistent AFib.  Rate control is satisfactory on oral diltiazem.   Plan Increase oral diltiazem to 240 mg qd  Amiodarone 200 mg for persistent AFib   2. CHF- by x-ray patient with pulmonary edema.  BNP was elevated yesterday. She appears in moderate respiratory distress.  She has had some improvement in peripheral edema.  Ultrasound indicates diastolic dysfunction.  Plan Continue IV lasix, 40 mg QID  Follow-up 2-view CXR  Cardiology was consulted to ensure her care is optimal  3. HTN     BP Readings from Last 3 Encounters:  01/10/13 146/82  01/07/13 134/60  07/05/12 155/54   Stable.   4. Renal - mild acute renal insufficiency. Unlikely to have nephrotic syndrome  Plan Continue to monitor fluid  balance.  Dispo - home when stable  [name]  01/10/2013, 7:14 AM

## 2013-01-11 ENCOUNTER — Inpatient Hospital Stay (HOSPITAL_COMMUNITY): Payer: Medicare Other

## 2013-01-11 DIAGNOSIS — I5031 Acute diastolic (congestive) heart failure: Secondary | ICD-10-CM

## 2013-01-11 DIAGNOSIS — I82409 Acute embolism and thrombosis of unspecified deep veins of unspecified lower extremity: Secondary | ICD-10-CM

## 2013-01-11 DIAGNOSIS — M7989 Other specified soft tissue disorders: Secondary | ICD-10-CM

## 2013-01-11 LAB — PRO B NATRIURETIC PEPTIDE: Pro B Natriuretic peptide (BNP): 1845 pg/mL — ABNORMAL HIGH (ref 0–450)

## 2013-01-11 LAB — CBC
MCHC: 32.2 g/dL (ref 30.0–36.0)
MCV: 93.1 fL (ref 78.0–100.0)
Platelets: 311 10*3/uL (ref 150–400)
RDW: 13.9 % (ref 11.5–15.5)
WBC: 7.1 10*3/uL (ref 4.0–10.5)

## 2013-01-11 LAB — BASIC METABOLIC PANEL
Calcium: 8.3 mg/dL — ABNORMAL LOW (ref 8.4–10.5)
Creatinine, Ser: 1.48 mg/dL — ABNORMAL HIGH (ref 0.50–1.10)
GFR calc Af Amer: 32 mL/min — ABNORMAL LOW (ref >90)
GFR calc non Af Amer: 28 mL/min — ABNORMAL LOW (ref >90)

## 2013-01-11 MED ORDER — METOPROLOL TARTRATE 25 MG PO TABS
25.0000 mg | ORAL_TABLET | Freq: Four times a day (QID) | ORAL | Status: DC
  Administered 2013-01-11 – 2013-01-16 (×20): 25 mg via ORAL
  Filled 2013-01-11 (×22): qty 1

## 2013-01-11 MED ORDER — IOHEXOL 350 MG/ML SOLN
50.0000 mL | Freq: Once | INTRAVENOUS | Status: AC | PRN
  Administered 2013-01-11: 80 mL via INTRAVENOUS

## 2013-01-11 MED ORDER — HEPARIN (PORCINE) IN NACL 100-0.45 UNIT/ML-% IJ SOLN
650.0000 [IU]/h | INTRAMUSCULAR | Status: DC
  Administered 2013-01-11: 750 [IU]/h via INTRAVENOUS
  Administered 2013-01-13 – 2013-01-15 (×2): 650 [IU]/h via INTRAVENOUS
  Filled 2013-01-11 (×4): qty 250

## 2013-01-11 MED ORDER — BISACODYL 10 MG RE SUPP
10.0000 mg | Freq: Every day | RECTAL | Status: DC | PRN
  Administered 2013-01-11: 10 mg via RECTAL
  Filled 2013-01-11: qty 1

## 2013-01-11 MED ORDER — METOLAZONE 2.5 MG PO TABS
2.5000 mg | ORAL_TABLET | Freq: Every day | ORAL | Status: DC
  Administered 2013-01-11 – 2013-01-15 (×5): 2.5 mg via ORAL
  Filled 2013-01-11 (×5): qty 1

## 2013-01-11 NOTE — Progress Notes (Signed)
SATURATION QUALIFICATIONS: (This note is used to comply with regulatory documentation for home oxygen)  Patient Saturations on Room Air at Rest = 89%  Patient Saturations on Room Air while Ambulating = 84%  Patient Saturations on 2 Liters of oxygen while Ambulating = 95%  Please briefly explain why patient needs home oxygen: Pt quickly fatigues during mobility especially without supplemental oxygen.  Pt needs  ~2 minutes to rebound and increase Sa02 with 2L oxygen using pursed lipped breathing technique.   [place], [place] DPT [phone]

## 2013-01-11 NOTE — Progress Notes (Signed)
Subjective: [name] is sitting up in the chair. She has no c/o but admits to being short of breath and cold. Denies chest pain.  Objective: Lab: No results found for this basename: WBC, NEUTROABS, HGB, HCT, MCV, PLT,  in the last 72 hours  Recent Labs  01/09/13 0505 01/10/13 0536 01/11/13 0645  NA 140 139 139  K 3.2* 3.6 4.5  CL 100 99 100  GLUCOSE 106* 134* 138*  BUN 18 19 21   CREATININE 1.48* 1.47* 1.48*  CALCIUM 8.4 8.3* 8.3*    Imaging:  Scheduled Meds: . acetaminophen  650 mg Oral BID  . amiodarone  200 mg Oral Daily  . aspirin  81 mg Oral Daily  . cilostazol  100 mg Oral BID  . diltiazem  240 mg Oral Daily  . enoxaparin (LOVENOX) injection  30 mg Subcutaneous Q24H  . feeding supplement  237 mL Oral TID BM  . furosemide  80 mg Intravenous BID  . metoprolol tartrate  25 mg Oral BID  . oxybutynin  5 mg Oral Daily  . senna  1 tablet Oral BID  . sodium chloride  3 mL Intravenous Q12H   Continuous Infusions: . sodium chloride 10 mL/hr at 01/08/13 1900   PRN Meds:.bisacodyl   Physical Exam: Filed Vitals:   01/11/13 0553  BP: 135/64  Pulse: 113  Temp: 98 F (36.7 C)  Resp: 20    Intake/Output Summary (Last 24 hours) at 01/11/13 1610 Last data filed at 01/11/13 9604  Gross per 24 hour  Intake    603 ml  Output    500 ml  Net    103 ml   Total this adm: +93 gen'l- very elderly white woman in no distress, bundled up and looking like sister [name]. HEENT- blind Neck - no JVD in the sitting position Cor - IRIR with rate 80-115 on tele Pulm - decreased Breath sounds at the bases, no rales Neuro - A&IO x 3     Assessment/Plan: 1. Card - remains in A. Fib despite low dose amiodarone. Rate is a little better - currently on diltiazem 240 and lopressor 25 bid.  Plan Continue present regimen  2. CHF -  Persistent increased WOB, poor diuresis, BNP 1845 < 1751< 2131. Furosemide changed to 80 mg IV q12.  Plan Will add zaroxyln 2.5 mg qAM  Continue  furosemide  3. HTN-  BP Readings from Last 3 Encounters:  01/11/13 135/64  01/07/13 134/60  07/05/12 155/54   Stable  4. Renal    Essentially no change  Dispo - home with [facility] when ready for d/c.  Met with grand daughter (POA) and gave a full update.    [name] IM (o) [phone]; (c) [phone] Call-grp - Patsi Sears IM  Tele: [phone]  01/11/2013, 9:06 AM

## 2013-01-11 NOTE — Progress Notes (Signed)
Pt's grand daughter Aggie Cosier at the bedside requesting to speak with Dr. Debby Bud and CM.  Dr Debby Bud informed and instructed will be over soon to see her and also notified her that CM not yet in their office and will asked her to come talk with her about [facility] RN.  Family member verbalized understanding.  Will continue to monitor.  Amanda Pea, Charity fundraiser.

## 2013-01-11 NOTE — Progress Notes (Signed)
*  Preliminary Results* Right lower extremity venous duplex completed. Right lower extremity is positive for deep vein thrombosis of indeterminate age involving the right distal femoral vein. There is also acute deep vein thrombosis of the right peroneal veins. There is no evidence of right Baker's cyst.  Preliminary results discussed with Nellie, RN.  01/11/2013 11:19 AM  Gertie Fey, RVT, RDCS, RDMS

## 2013-01-11 NOTE — Progress Notes (Signed)
Pt's grand -daughter Aggie Cosier requesting further assistance from dietary to order pt's lunch and dinner.  Spoke with some from the kitchen on the floor and also called assigned dietician Sam who is going to come on the floor to talk with grand daughter.  Amanda Pea, Charity fundraiser.

## 2013-01-11 NOTE — Progress Notes (Signed)
Pt's grand daughter Aggie Cosier stated "oxygen co. Had called her and told her they will deliver 02 tomorrow.  Amanda Pea, Charity fundraiser.

## 2013-01-11 NOTE — Progress Notes (Signed)
Nutrition Brief Note  RD contacted by RN, Nellie regarding patient's foodservice issues. Family is concerned regarding patient's food choices with menu and pt being unable to order foods that seem appropriate for Heart Healthy diet. RD contacted Dr. Debby Bud and approved a diet liberalization from Heart Healthy to Regular. Appreciative of coordination of care with MD.  Discussed diet liberalization with family and they were appreciative of information and coordination of care.  Body mass index is 27.6 kg/(m^2). Patient meets criteria for overweight based on current BMI.   Current diet order is Heart Healthy, patient is consuming approximately 0-100% of meals at this time. Labs and medications reviewed.   No nutrition interventions warranted at this time. If nutrition issues arise, please consult RD.   Jarold Motto MS, RD, LDN Pager: [phone] After-hours pager: [phone]

## 2013-01-11 NOTE — Progress Notes (Signed)
Physical Therapy Treatment Patient Details Name: [name] MRN: [id number] DOB: 1912-03-16 Today's Date: 01/11/2013 Time: 4782-9562 PT Time Calculation (min): 34 min  PT Assessment / Plan / Recommendation  History of Present Illness 79 year-old lady with a past medical history of hypertension and peripheral vascular disease who presents today due to ankle swelling of two weeks' duration. Her peripheral vascular disease is managed by Dr. Rose Fillers; her last visit was 07/05/12. She edema is normally managed with 40 mg lasix BID. Ten days ago, she changed to 60 mg BID on the advice of her daughter, which stabilized the swelling but did not reduce swelling.   PT Comments   Pt able to increase ambulation distance however too fatigue to complete stair negotiation after ambulation.  Spoke in length with caregiver re:  Home and the need for constant close supervision and (A) as well as HHPT to further evaluate pt in home environment.  Pt may need home O2 however may increase fall risk due to legally blind.  Need HHPT safety evaluation.   Follow Up Recommendations  Home health PT;Supervision/Assistance - 24 hour     Equipment Recommendations  Rolling walker with 5" wheels;Other (comment) (may need home O2, )    Frequency Min 3X/week   Progress towards PT Goals Progress towards PT goals: Progressing toward goals  Plan Current plan remains appropriate    Precautions / Restrictions Precautions Precautions: Fall Restrictions Weight Bearing Restrictions: No   Pertinent Vitals/Pain SaO2 decreased to 84% with ambulation on RA; Increased to 95% on 2L after pursed lipped breathing performed.     Mobility  Bed Mobility Bed Mobility: Not assessed Transfers Transfers: Sit to Stand;Stand to Sit Sit to Stand: 4: Min guard;From chair/3-in-1 Stand to Sit: 4: Min guard;To chair/3-in-1 Details for Transfer Assistance: Minguard for safety with cues for hand placement Ambulation/Gait Ambulation/Gait  Assistance: 4: Min guard;4: Min assist Ambulation Distance (Feet): 100 Feet Assistive device: Rolling walker Ambulation/Gait Assistance Details: Minguard for safety with (A) to manage RW and direct pt due to legally bline.  Pt stated "I can't use one of these in the house there isn't enough room."  HHPT to evaluate other safety for fall risk.  Gait Pattern: Step-through pattern;Decreased stride length;Shuffle Gait velocity: slow General Gait Details: Pt fatigued quickly & c/o LE weakness & appeared to become anxious & fearful of falling.   Stairs: No (unable to complete due to overall fatigue) Wheelchair Mobility Wheelchair Mobility: No    Exercises     PT Diagnosis:    PT Problem List:   PT Treatment Interventions:     PT Goals (current goals can now be found in the care plan section) Acute Rehab PT Goals Patient Stated Goal: home PT Goal Formulation: With patient/family Time For Goal Achievement: 01/15/13 Potential to Achieve Goals: Good  Visit Information  Last PT Received On: 01/11/13 Assistance Needed: +1 History of Present Illness: 79 year-old lady with a past medical history of hypertension and peripheral vascular disease who presents today due to ankle swelling of two weeks' duration. Her peripheral vascular disease is managed by Dr. Rose Fillers; her last visit was 07/05/12. She edema is normally managed with 40 mg lasix BID. Ten days ago, she changed to 60 mg BID on the advice of her daughter, which stabilized the swelling but did not reduce swelling.    Subjective Data  Subjective: "I want to go home." Patient Stated Goal: home   Cognition  Cognition Arousal/Alertness: Awake/alert Behavior During Therapy: [facility] for tasks  assessed/performed Overall Cognitive Status: Within Functional Limits for tasks assessed    Balance     End of Session PT - End of Session Equipment Utilized During Treatment: Gait belt;Oxygen (3L) Activity Tolerance: Patient limited by fatigue Patient  left: in chair;with call bell/phone within reach;with family/visitor present Nurse Communication: Mobility status   GP     Quavion Boule 01/11/2013, 8:50 AM  Jake Shark, PT DPT [phone]

## 2013-01-11 NOTE — Progress Notes (Signed)
Pt is positive for RLE/LLE DVT, Dr. Tenny Craw ordered to start heparin drip and CT of chest to r/o PE.Pt resting in bed, heparin drip started at 7.5cc/hr. Will continue to monitor.

## 2013-01-11 NOTE — Progress Notes (Signed)
Per the patient's chart, she had not had a bowel movement since she has been in the hospital.  MD notified.  New orders given for a 10 mg Dulcolax suppository.

## 2013-01-11 NOTE — Progress Notes (Signed)
Pt RLE venous duplex result obtained to day attempted to call to Dr. Debby Bud, no answer at this time.  Message left on answering phone service to call RN r/t to pt's test result.  Amanda Pea, Charity fundraiser.

## 2013-01-11 NOTE — Progress Notes (Signed)
Pt went to CT scan via bed per transport.

## 2013-01-11 NOTE — Progress Notes (Addendum)
Subjective: Patient is still SOB with exertion   NO CP Objective: Filed Vitals:   01/10/13 2037 01/11/13 0553 01/11/13 0931 01/11/13 1419  BP: 121/54 135/64 148/63 130/42  Pulse: 102 113 117 81  Temp: 97.3 F (36.3 C) 98 F (36.7 C)  98.1 F (36.7 C)  TempSrc: Oral Oral  Oral  Resp: 19 20  18   Height:      Weight:  136 lb 11.2 oz (62.007 kg)    SpO2: 97% 93%  98%   Weight change: 1.6 oz (0.045 kg)  Intake/Output Summary (Last 24 hours) at 01/11/13 1854 Last data filed at 01/11/13 1422  Gross per 24 hour  Intake    643 ml  Output    350 ml  Net    293 ml    General: Alert, awake, oriented x3, in no acute distress Neck:  JVP is normal Heart: 1rreg egular rate and rhythm, without murmurs, rubs, gallops.  Lungs: Clear to auscultation.  No rales or wheezes. Exemities:  Tr  edema.   Neuro: Grossly intact, nonfocal.   Tele:  Afib  100s   Lab Results: Results for orders placed during the hospital encounter of 01/07/13 (from the past 24 hour(s))  BASIC METABOLIC PANEL     Status: Abnormal   Collection Time    01/11/13  6:45 AM      Result Value Range   Sodium 139  135 - 145 mEq/L   Potassium 4.5  3.5 - 5.1 mEq/L   Chloride 100  96 - 112 mEq/L   CO2 30  19 - 32 mEq/L   Glucose, Bld 138 (*) 70 - 99 mg/dL   BUN 21  6 - 23 mg/dL   Creatinine, Ser 1.19 (*) 0.50 - 1.10 mg/dL   Calcium 8.3 (*) 8.4 - 10.5 mg/dL   GFR calc non Af Amer 28 (*) >90 mL/min   GFR calc Af Amer 32 (*) >90 mL/min  PRO B NATRIURETIC PEPTIDE     Status: Abnormal   Collection Time    01/11/13  6:45 AM      Result Value Range   Pro B Natriuretic peptide (BNP) 1845.0 (*) 0 - 450 pg/mL    Studies/Results: @RISRSLT24 @  Medications: Reviewed   @PROBHOSP @  1.  Dyspnea  Patient had LE dopplers today that were positive for R femoral DVT (unclear on age) and acute L peroneal DVT.  Question if had PE.  (Patient's granddaughter says that pt has been more sedentary recently  Pt's son had surgery 10 wks ago  had was not able to help Ms Paczkowski get around outside)  Recomm: Would begin heparin I would recomm CT to look for PE  Discussed with radiology about minimizing contrast  WIll start some IV fluids.   WIll continue diurese after  2.  Afib  Rates are still a little high  Would increase lopressor to q 6 hours  LOS: 4 days   Dietrich Pates 01/11/2013, 6:54 PM

## 2013-01-11 NOTE — Progress Notes (Signed)
ANTICOAGULATION CONSULT NOTE - Initial Consult  Pharmacy Consult for Heparin Indication: Acute RLE DVT and r/o PE  Allergies  Allergen Reactions  . Codone [Hydrocodone]     hallucinations  . Codeine Other (See Comments)    Hallucinations/abnormal behavior Per family member pt can not tolerate strong pain medications    Patient Measurements: Height: 4\' 11"  (149.9 cm) Weight: 136 lb 11.2 oz (62.007 kg) IBW/kg (Calculated) : 43.2 Heparin Dosing Weight: 56.2  Vital Signs: Temp: 98.1 F (36.7 C) (08/15 1419) Temp src: Oral (08/15 1419) BP: 130/42 mmHg (08/15 1419) Pulse Rate: 81 (08/15 1419)  Labs:  Recent Labs  01/09/13 0505 01/10/13 0536 01/11/13 0645 01/11/13 2036  HGB  --   --   --  9.1*  HCT  --   --   --  28.3*  PLT  --   --   --  311  CREATININE 1.48* 1.47* 1.48*  --     Estimated Creatinine Clearance: 16.6 ml/min (by C-G formula based on Cr of 1.48).   Medical History: Past Medical History  Diagnosis Date  . Peripheral vascular disease   . Peripheral edema   . COPD (chronic obstructive pulmonary disease)   . Hypertension   . Macular degeneration     bilateral  . Irritable bladder   . Rectal fissure   . Enteritis   . Duodenitis   . Esophagitis   . DJD (degenerative joint disease)   . Dyspepsia   . GERD (gastroesophageal reflux disease)     Assessment: 79 y.o. F with hx PVD admitted on 8/11 with ankle swelling x 2 weeks. The patient was noted to have new-onset Afib this admit thought to be d/t fluid overload -- to be managed on ASA as risk outweighed the benefits (per cardiology) for long-term anticoagulation. Dopplers on 8/15 confirmed a RLE DVT -- also concerned for PE given the patient's continued dypsnea. Pharmacy was consulted to start heparin for anticoagulation. Will need to re-evaluate use of oral anticoagulation in the setting of new DVT.  The patient received a dose of Lovenox 30 mg SQ at 1800 this evening for DVT prophylaxis. Given this  along with the patient's advanced age -- will not bolus heparin. Hgb/Hct 9.1/28.3, plts wnl. Hep Wt: 56.2 kg, Scr 1.48, CrCl~15 ml/min.  Goal of Therapy:  Heparin level 0.3-0.7 units/ml Monitor platelets by anticoagulation protocol: Yes   Plan:  1. Discontinue Lovenox SQ 2. Initiate heparin drip at a rate of 750 units/hr (7.5 ml/hr) 3. Daily heparin levels, CBC 4. Will continue to monitor for any signs/symptoms of bleeding and will follow up with heparin level in 8 hours   Georgina Pillion, PharmD, BCPS Clinical Pharmacist Pager: [phone] 01/11/2013 9:19 PM

## 2013-01-11 NOTE — Progress Notes (Signed)
01/11/13 1430 In to speak with pt. and granddaughter, Rosey Bath.  Pt. is interested in having home health, and pt. chose [facility].  TC to Lupita Leash, with [facility], to give referral for [facility] PT/OT, RN.  In addition, pt. qualifies for home continuous oxygen, and this NCM will obtain orders.  Physician, please write for [facility] RN, PT/OT, and complete face to face documentation, as pt. has Medicare.  Currently pt. is on IV Lasix, therefore will be INPT for at least 2 more days. Tera Mater, RN, BSN NCM [phone]

## 2013-01-12 LAB — PROTIME-INR
INR: 1.08 (ref 0.00–1.49)
Prothrombin Time: 13.8 seconds (ref 11.6–15.2)

## 2013-01-12 LAB — BASIC METABOLIC PANEL
BUN: 21 mg/dL (ref 6–23)
CO2: 32 mEq/L (ref 19–32)
Calcium: 8.3 mg/dL — ABNORMAL LOW (ref 8.4–10.5)
Creatinine, Ser: 1.49 mg/dL — ABNORMAL HIGH (ref 0.50–1.10)
GFR calc Af Amer: 32 mL/min — ABNORMAL LOW (ref >90)

## 2013-01-12 LAB — FERRITIN: Ferritin: 22 ng/mL (ref 10–291)

## 2013-01-12 LAB — HEPARIN LEVEL (UNFRACTIONATED): Heparin Unfractionated: 0.8 IU/mL — ABNORMAL HIGH (ref 0.30–0.70)

## 2013-01-12 LAB — CBC
Hemoglobin: 8.2 g/dL — ABNORMAL LOW (ref 12.0–15.0)
MCHC: 32.4 g/dL (ref 30.0–36.0)
Platelets: 282 10*3/uL (ref 150–400)
RDW: 13.8 % (ref 11.5–15.5)

## 2013-01-12 LAB — PRO B NATRIURETIC PEPTIDE: Pro B Natriuretic peptide (BNP): 2078 pg/mL — ABNORMAL HIGH (ref 0–450)

## 2013-01-12 LAB — IRON AND TIBC: TIBC: 312 ug/dL (ref 250–470)

## 2013-01-12 MED ORDER — POTASSIUM CHLORIDE CRYS ER 20 MEQ PO TBCR
20.0000 meq | EXTENDED_RELEASE_TABLET | Freq: Two times a day (BID) | ORAL | Status: DC
  Administered 2013-01-12 (×2): 20 meq via ORAL
  Filled 2013-01-12 (×4): qty 1

## 2013-01-12 NOTE — Progress Notes (Addendum)
Subjective: Less dyspnea.  Objective: Vital signs in last 24 hours: Temp:  [98 F (36.7 C)-98.2 F (36.8 C)] 98 F (36.7 C) (08/16 0539) Pulse Rate:  [81-117] 103 (08/16 0539) Resp:  [18-20] 20 (08/16 0539) BP: (130-148)/(42-76) 137/76 mmHg (08/16 0539) SpO2:  [93 %-98 %] 96 % (08/16 0539) Weight:  [63.4 kg (139 lb 12.4 oz)] 63.4 kg (139 lb 12.4 oz) (08/16 0539) Weight change: 1.393 kg (3 lb 1.2 oz) Last BM Date: 01/11/13  Intake/Output from previous day: 08/15 0701 - 08/16 0700 In: 1201.8 [P.O.:640; I.V.:561.8] Out: 1800 [Urine:1800] Intake/Output this shift:    General appearance: alert Resp: clear to auscultation bilaterally Cardio: irregularly irregular rhythm GI: soft, non-tender; bowel sounds normal; no masses,  no organomegaly Extremities: edema 1+  Lab Results:  Recent Labs  01/11/13 2036 01/12/13 0600  WBC 7.1 7.1  HGB 9.1* 8.2*  HCT 28.3* 25.3*  PLT 311 282   BMET  Recent Labs  01/11/13 0645 01/12/13 0600  NA 139 135  K 4.5 3.5  CL 100 95*  CO2 30 32  GLUCOSE 138* 96  BUN 21 21  CREATININE 1.48* 1.49*  CALCIUM 8.3* 8.3*    Studies/Results: Ct Angio Chest Pe W/cm &/or Wo Cm  01/12/2013   *RADIOLOGY REPORT*  Clinical Data: Short of breath, evaluate for PE  CT ANGIOGRAPHY CHEST  Technique:  Multidetector CT imaging of the chest using the standard protocol during bolus administration of intravenous contrast. Multiplanar reconstructed images including MIPs were obtained and reviewed to evaluate the vascular anatomy.  Contrast: 80mL OMNIPAQUE IOHEXOL 350 MG/ML SOLN  Comparison: Chest x-ray 01/09/2013  Findings:  Mediastinum: Unremarkable CT appearance of the thyroid gland.  No suspicious mediastinal or hilar adenopathy.  No soft tissue mediastinal mass.  Small hiatal hernia.  Heart/Vascular: Excellent opacification of the pulmonary arteries to the subsegmental level.  No central filling defect to suggest acute pulmonary embolus.  Conventional  three-vessel aortic arch. The ascending aorta is ectatic with a maximal diameter of 3.8 cm. Mild calcifications are noted of the aortic valve.  Cardiomegaly with biatrial enlargement.  Calcifications are noted throughout the coronary arteries.  No pericardial effusion.  Mild jugular venous distension.  Lungs/Pleura: Moderate to large bilateral layering pleural effusions.  There is associated bibasilar atelectasis.  Mild interlobular septal thickening in the apices consistent with trace edema.  No focal airspace consolidation.  Upper Abdomen: Extensive atherosclerotic calcifications of the visualized splanchnic vessels.  Otherwise, limited visualization of the upper abdomen is unremarkable.  Bones: No acute fracture or aggressive appearing lytic or blastic osseous lesion.  IMPRESSION:  1.  Negative for acute pulmonary embolus. 2.  Moderately large bilateral layering pleural effusions and associated lower lobe atelectasis. 3.  Mild pulmonary interstitial edema secondary to either volume overload or mild CHF. 4.  Cardiomegaly with biatrial enlargement. 5.  Atherosclerosis including multivessel coronary artery disease.   Original Report Authenticated By: Malachy Moan, M.D.    Medications: I have reviewed the patient's current medications.  Assessment/Plan: 1. Card - remains in A. Fib despite low dose amiodarone. Rate about 100 - currently on diltiazem 240 and lopressor 25 q6  Plan Continue present regimen  2. CHF, diastolic - seems improved, less dyspnea. Furosemide 80 mg IV q12.  zaroxyln 2.5 mg qAM  Added.  Rceiving some IVFs for CT with contrast, no change in Cr, discontinue.  Start KCL 3. HTN-  ok BP Readings from Last 3 Encounters:   01/11/13  135/64   01/07/13  134/60  4. Renal Essentially no change  5. Bilateral DVT new diagnosis, on IV heparin.  CT neg PE.  Will need oral anticoagulant for both DVT and atrial fibrillation..check PT 6.  Anemia, check iron studies and b12/folate.   Follow Dispo - home with [facility] when ready for d/c.  Met with grand daughter (POA) and gave a full update.     LOS: 5 days   [name] JOSEPH 01/12/2013, 8:27 AM

## 2013-01-12 NOTE — Progress Notes (Signed)
PT Cancellation Note  Patient Details Name: [name] MRN: [id number] DOB: 10-26-1911   Cancelled Treatment:    Reason Eval/Treat Not Completed: Patient not medically ready. RN asked to hold due to pt with + R LE DVT and just began heparin tx. PT to return as able.   Marcene Brawn 01/12/2013, 2:16 PM

## 2013-01-12 NOTE — Progress Notes (Signed)
Subjective:  Breathing is improved No chest pain or palpitations  Objective:  Filed Vitals:   01/11/13 0931 01/11/13 1419 01/11/13 2141 01/12/13 0539  BP: 148/63 130/42 141/68 137/76  Pulse: 117 81 115 103  Temp:  98.1 F (36.7 C) 98.2 F (36.8 C) 98 F (36.7 C)  TempSrc:  Oral Oral Axillary  Resp:  18 19 20   Height:      Weight:    139 lb 12.4 oz (63.4 kg)  SpO2:  98% 93% 96%    Intake/Output from previous day:  Intake/Output Summary (Last 24 hours) at 01/12/13 0935 Last data filed at 01/12/13 1610  Gross per 24 hour  Intake 1201.75 ml  Output   1800 ml  Net -598.25 ml    Physical Exam: Affect appropriate Frail elderly female HEENT: Legally Blind  Neck supple with no adenopathy JVP normal no bruits no thyromegaly Lungs Decreased BS both bases no wheezing and good diaphragmatic motion Heart:  S1/S2SEM  murmur, no rub, gallop or click PMI normal Abdomen: benighn, BS positve, no tenderness, no AAA no bruit.  No HSM or HJR Distal pulses intact with no bruits No edema Neuro non-focal Skin warm and dry No muscular weakness   Lab Results: Basic Metabolic Panel:  Recent Labs  96/04/54 0645 01/12/13 0600  NA 139 135  K 4.5 3.5  CL 100 95*  CO2 30 32  GLUCOSE 138* 96  BUN 21 21  CREATININE 1.48* 1.49*  CALCIUM 8.3* 8.3*   CBC:  Recent Labs  01/11/13 2036 01/12/13 0600  WBC 7.1 7.1  HGB 9.1* 8.2*  HCT 28.3* 25.3*  MCV 93.1 93.0  PLT 311 282    Imaging: Ct Angio Chest Pe W/cm &/or Wo Cm  01/12/2013   *RADIOLOGY REPORT*  Clinical Data: Short of breath, evaluate for PE  CT ANGIOGRAPHY CHEST  Technique:  Multidetector CT imaging of the chest using the standard protocol during bolus administration of intravenous contrast. Multiplanar reconstructed images including MIPs were obtained and reviewed to evaluate the vascular anatomy.  Contrast: 80mL OMNIPAQUE IOHEXOL 350 MG/ML SOLN  Comparison: Chest x-ray 01/09/2013  Findings:  Mediastinum:  Unremarkable CT appearance of the thyroid gland.  No suspicious mediastinal or hilar adenopathy.  No soft tissue mediastinal mass.  Small hiatal hernia.  Heart/Vascular: Excellent opacification of the pulmonary arteries to the subsegmental level.  No central filling defect to suggest acute pulmonary embolus.  Conventional three-vessel aortic arch. The ascending aorta is ectatic with a maximal diameter of 3.8 cm. Mild calcifications are noted of the aortic valve.  Cardiomegaly with biatrial enlargement.  Calcifications are noted throughout the coronary arteries.  No pericardial effusion.  Mild jugular venous distension.  Lungs/Pleura: Moderate to large bilateral layering pleural effusions.  There is associated bibasilar atelectasis.  Mild interlobular septal thickening in the apices consistent with trace edema.  No focal airspace consolidation.  Upper Abdomen: Extensive atherosclerotic calcifications of the visualized splanchnic vessels.  Otherwise, limited visualization of the upper abdomen is unremarkable.  Bones: No acute fracture or aggressive appearing lytic or blastic osseous lesion.  IMPRESSION:  1.  Negative for acute pulmonary embolus. 2.  Moderately large bilateral layering pleural effusions and associated lower lobe atelectasis. 3.  Mild pulmonary interstitial edema secondary to either volume overload or mild CHF. 4.  Cardiomegaly with biatrial enlargement. 5.  Atherosclerosis including multivessel coronary artery disease.   Original Report Authenticated By: Malachy Moan, M.D.    Cardiac Studies:  ECG:  afib rate 87  nonspecific ST/T wave changes    Telemetry: afib rates improved 01/12/2013  Echo:  EF 60-65% very mild AS  Medications:   . acetaminophen  650 mg Oral BID  . amiodarone  200 mg Oral Daily  . aspirin  81 mg Oral Daily  . cilostazol  100 mg Oral BID  . diltiazem  240 mg Oral Daily  . feeding supplement  237 mL Oral TID BM  . furosemide  80 mg Intravenous BID  . metolazone   2.5 mg Oral Daily  . metoprolol tartrate  25 mg Oral QID  . oxybutynin  5 mg Oral Daily  . potassium chloride  20 mEq Oral BID  . senna  1 tablet Oral BID  . sodium chloride  3 mL Intravenous Q12H     . sodium chloride 50 mL/hr at 01/11/13 2003  . heparin 650 Units/hr (01/12/13 0730)    Assessment/Plan:  Afib:  Rate control better Not a candidate for anticoagulation or aggressive cardioversion CHF: ? Diastolic.  Continue lasix and zaroxyln  Pleural effusions more impressive on CT than CXR Will repeat CXR but initial one did not appear to be appr for thoracentesis  Charlton Haws 01/12/2013, 9:35 AM

## 2013-01-12 NOTE — Progress Notes (Signed)
ANTICOAGULATION CONSULT NOTE   Pharmacy Consult for Heparin Indication: Acute RLE DVT and r/o PE  Allergies  Allergen Reactions  . Codone [Hydrocodone]     hallucinations  . Codeine Other (See Comments)    Hallucinations/abnormal behavior Per family member pt can not tolerate strong pain medications    Patient Measurements: Height: 4\' 11"  (149.9 cm) Weight: 139 lb 12.4 oz (63.4 kg) IBW/kg (Calculated) : 43.2 Heparin Dosing Weight: 56.2  Vital Signs: Temp: 98.2 F (36.8 C) (08/16 1409) Temp src: Oral (08/16 1409) BP: 126/61 mmHg (08/16 1409) Pulse Rate: 104 (08/16 1409)  Labs:  Recent Labs  01/10/13 0536 01/11/13 0645 01/11/13 2036 01/12/13 0600 01/12/13 1030 01/12/13 1751  HGB  --   --  9.1* 8.2*  --   --   HCT  --   --  28.3* 25.3*  --   --   PLT  --   --  311 282  --   --   LABPROT  --   --   --   --  13.8  --   INR  --   --   --   --  1.08  --   HEPARINUNFRC  --   --   --  0.80*  --  0.52  CREATININE 1.47* 1.48*  --  1.49*  --   --     Estimated Creatinine Clearance: 16.7 ml/min (by C-G formula based on Cr of 1.49).  Assessment: 79 y.o. F with hx PVD admitted on 8/11 with ankle swelling x 2 weeks. The patient was noted to have new-onset Afib this admit thought to be d/t fluid overload -- to be managed on ASA as risk outweighed the benefits (per cardiology) for long-term anticoagulation. Dopplers on 8/15 confirmed a RLE DVT -- also concerned for PE given the patient's continued dypsnea. Pharmacy was consulted to start heparin for anticoagulation. Will need to re-evaluate use of oral anticoagulation in the setting of new DVT.  Heparin drip now down within therapeutic range after dose adjustment this morning (0.52). No bleeding issues have been noted.   Goal of Therapy:  Heparin level 0.3-0.7 units/ml Monitor platelets by anticoagulation protocol: Yes   Plan:  1. Continue heparin at 650 units/hr  2. Daily heparin levels, CBC 3. Will continue to monitor for  any signs/symptoms of bleeding   Sheppard Coil PharmD., BCPS Clinical Pharmacist Pager [phone] 01/12/2013 7:07 PM

## 2013-01-12 NOTE — Progress Notes (Signed)
ANTICOAGULATION CONSULT NOTE - Initial Consult  Pharmacy Consult for Heparin Indication: Acute RLE DVT and r/o PE  Allergies  Allergen Reactions  . Codone [Hydrocodone]     hallucinations  . Codeine Other (See Comments)    Hallucinations/abnormal behavior Per family member pt can not tolerate strong pain medications    Patient Measurements: Height: 4\' 11"  (149.9 cm) Weight: 139 lb 12.4 oz (63.4 kg) IBW/kg (Calculated) : 43.2 Heparin Dosing Weight: 56.2  Vital Signs: Temp: 98 F (36.7 C) (08/16 0539) Temp src: Axillary (08/16 0539) BP: 137/76 mmHg (08/16 0539) Pulse Rate: 103 (08/16 0539)  Labs:  Recent Labs  01/10/13 0536 01/11/13 0645 01/11/13 2036 01/12/13 0600  HGB  --   --  9.1* 8.2*  HCT  --   --  28.3* 25.3*  PLT  --   --  311 282  HEPARINUNFRC  --   --   --  0.80*  CREATININE 1.47* 1.48*  --   --     Estimated Creatinine Clearance: 16.8 ml/min (by C-G formula based on Cr of 1.48).   Medical History: Past Medical History  Diagnosis Date  . Peripheral vascular disease   . Peripheral edema   . COPD (chronic obstructive pulmonary disease)   . Hypertension   . Macular degeneration     bilateral  . Irritable bladder   . Rectal fissure   . Enteritis   . Duodenitis   . Esophagitis   . DJD (degenerative joint disease)   . Dyspepsia   . GERD (gastroesophageal reflux disease)     Assessment:       HL 0.80. No bleeding noted. Possible small effect of lovenox remains from 0.5/kg dose last evening at 1800. Will decrease to 650 units/hr and recheck HL at 1800 ---any effect of lovenox should be negligible at this time.      Goal of Therapy:  Heparin level 0.3-0.7 units/ml Monitor platelets by anticoagulation protocol: Yes   Plan:  Decrease heparin to 650 units/hr and recheck level at 1800 today.

## 2013-01-13 ENCOUNTER — Inpatient Hospital Stay (HOSPITAL_COMMUNITY): Payer: Medicare Other

## 2013-01-13 LAB — BASIC METABOLIC PANEL
CO2: 35 mEq/L — ABNORMAL HIGH (ref 19–32)
Chloride: 93 mEq/L — ABNORMAL LOW (ref 96–112)
GFR calc Af Amer: 32 mL/min — ABNORMAL LOW (ref >90)
Potassium: 3.2 mEq/L — ABNORMAL LOW (ref 3.5–5.1)
Sodium: 137 mEq/L (ref 135–145)

## 2013-01-13 LAB — CBC
Hemoglobin: 8.2 g/dL — ABNORMAL LOW (ref 12.0–15.0)
Platelets: 272 10*3/uL (ref 150–400)
RBC: 2.75 MIL/uL — ABNORMAL LOW (ref 3.87–5.11)
WBC: 5.8 10*3/uL (ref 4.0–10.5)

## 2013-01-13 LAB — HEPARIN LEVEL (UNFRACTIONATED): Heparin Unfractionated: 0.46 IU/mL (ref 0.30–0.70)

## 2013-01-13 MED ORDER — FERROUS SULFATE 325 (65 FE) MG PO TABS
325.0000 mg | ORAL_TABLET | Freq: Every day | ORAL | Status: DC
  Administered 2013-01-14 – 2013-01-16 (×3): 325 mg via ORAL
  Filled 2013-01-13 (×4): qty 1

## 2013-01-13 MED ORDER — POTASSIUM CHLORIDE CRYS ER 20 MEQ PO TBCR
20.0000 meq | EXTENDED_RELEASE_TABLET | Freq: Once | ORAL | Status: AC
  Administered 2013-01-13: 20 meq via ORAL

## 2013-01-13 MED ORDER — POTASSIUM CHLORIDE CRYS ER 20 MEQ PO TBCR
20.0000 meq | EXTENDED_RELEASE_TABLET | Freq: Three times a day (TID) | ORAL | Status: DC
  Administered 2013-01-13 – 2013-01-16 (×11): 20 meq via ORAL
  Filled 2013-01-13 (×11): qty 1

## 2013-01-13 NOTE — Progress Notes (Addendum)
Subjective: Less dyspnea, no leg pain.  Objective: Vital signs in last 24 hours: Temp:  [98 F (36.7 C)-98.2 F (36.8 C)] 98.1 F (36.7 C) (08/17 0627) Pulse Rate:  [75-104] 75 (08/17 0627) Resp:  [20] 20 (08/17 0627) BP: (126-150)/(61-71) 150/61 mmHg (08/17 0627) SpO2:  [96 %] 96 % (08/17 0627) Weight:  [60.374 kg (133 lb 1.6 oz)] 60.374 kg (133 lb 1.6 oz) (08/17 0627) Weight change: -3.026 kg (-6 lb 10.8 oz) Last BM Date: 01/11/13  Intake/Output from previous day: 08/16 0701 - 08/17 0700 In: 865.5 [P.O.:708; I.V.:157.5] Out: 1300 [Urine:1300] Intake/Output this shift:    General appearance: alert and cooperative Resp: decreased breath sounds at  bases Cardio: irregularly irregular rhythm Extremities: edema trace bilaterally, no calf tenderness  Lab Results:  Recent Labs  01/12/13 0600 01/13/13 0346  WBC 7.1 5.8  HGB 8.2* 8.2*  HCT 25.3* 25.4*  PLT 282 272   BMET  Recent Labs  01/12/13 0600 01/13/13 0346  NA 135 137  K 3.5 3.2*  CL 95* 93*  CO2 32 35*  GLUCOSE 96 90  BUN 21 19  CREATININE 1.49* 1.48*  CALCIUM 8.3* 8.4    Studies/Results: Ct Angio Chest Pe W/cm &/or Wo Cm  01/12/2013   *RADIOLOGY REPORT*  Clinical Data: Short of breath, evaluate for PE  CT ANGIOGRAPHY CHEST  Technique:  Multidetector CT imaging of the chest using the standard protocol during bolus administration of intravenous contrast. Multiplanar reconstructed images including MIPs were obtained and reviewed to evaluate the vascular anatomy.  Contrast: 80mL OMNIPAQUE IOHEXOL 350 MG/ML SOLN  Comparison: Chest x-ray 01/09/2013  Findings:  Mediastinum: Unremarkable CT appearance of the thyroid gland.  No suspicious mediastinal or hilar adenopathy.  No soft tissue mediastinal mass.  Small hiatal hernia.  Heart/Vascular: Excellent opacification of the pulmonary arteries to the subsegmental level.  No central filling defect to suggest acute pulmonary embolus.  Conventional three-vessel aortic  arch. The ascending aorta is ectatic with a maximal diameter of 3.8 cm. Mild calcifications are noted of the aortic valve.  Cardiomegaly with biatrial enlargement.  Calcifications are noted throughout the coronary arteries.  No pericardial effusion.  Mild jugular venous distension.  Lungs/Pleura: Moderate to large bilateral layering pleural effusions.  There is associated bibasilar atelectasis.  Mild interlobular septal thickening in the apices consistent with trace edema.  No focal airspace consolidation.  Upper Abdomen: Extensive atherosclerotic calcifications of the visualized splanchnic vessels.  Otherwise, limited visualization of the upper abdomen is unremarkable.  Bones: No acute fracture or aggressive appearing lytic or blastic osseous lesion.  IMPRESSION:  1.  Negative for acute pulmonary embolus. 2.  Moderately large bilateral layering pleural effusions and associated lower lobe atelectasis. 3.  Mild pulmonary interstitial edema secondary to either volume overload or mild CHF. 4.  Cardiomegaly with biatrial enlargement. 5.  Atherosclerosis including multivessel coronary artery disease.   Original Report Authenticated By: Malachy Moan, M.D.    Medications: I have reviewed the patient's current medications.  Assessment/Plan: 1. Card - remains in A. Fib despite low dose amiodarone. Rate about 100 - currently on diltiazem 240 and lopressor 25 q6  Plan Continue present regimen  2. CHF, diastolic - seems improved, less dyspnea. Furosemide 80 mg IV q12.  zaroxyln 2.5 mg qAM Added.  Started KCL, increase dose.  3. HTN- ok  BP Readings from Last 3 Encounters:            4. Renal Essentially no change  5. Bilateral DVT new diagnosis,  on IV heparin. CT neg PE. At advanced age not a good longterm anticoagulation candidate, the acute DVT is in the calf and asymptomatic. Aso age indeterminate R thigh DVT present.  Discussed with cardiology, will order IVC filter. 6. Anemia, iron low, start iron.  Follow  Dispo - home with [facility] when ready for d/c.     LOS: 6 days   Avanthika Dehnert JOSEPH 01/13/2013, 8:03 AM

## 2013-01-13 NOTE — Progress Notes (Signed)
ANTICOAGULATION CONSULT NOTE   Pharmacy Consult for Heparin Indication: Acute RLE DVT and r/o PE  Allergies  Allergen Reactions  . Codone [Hydrocodone]     hallucinations  . Codeine Other (See Comments)    Hallucinations/abnormal behavior Per family member pt can not tolerate strong pain medications    Patient Measurements: Height: 4\' 11"  (149.9 cm) Weight: 133 lb 1.6 oz (60.374 kg) (Bed) IBW/kg (Calculated) : 43.2 Heparin Dosing Weight: 56.2  Vital Signs: Temp: 97.7 F (36.5 C) (08/17 1352) Temp src: Oral (08/17 1352) BP: 125/45 mmHg (08/17 1352) Pulse Rate: 82 (08/17 1352)  Labs:  Recent Labs  01/11/13 0645  01/11/13 2036 01/12/13 0600 01/12/13 1030 01/12/13 1751 01/13/13 0346  HGB  --   < > 9.1* 8.2*  --   --  8.2*  HCT  --   --  28.3* 25.3*  --   --  25.4*  PLT  --   --  311 282  --   --  272  LABPROT  --   --   --   --  13.8  --   --   INR  --   --   --   --  1.08  --   --   HEPARINUNFRC  --   --   --  0.80*  --  0.52 0.46  CREATININE 1.48*  --   --  1.49*  --   --  1.48*  < > = values in this interval not displayed.  Estimated Creatinine Clearance: 16.4 ml/min (by C-G formula based on Cr of 1.48).  Assessment: 79 y.o. F with hx PVD admitted on 8/11 with ankle swelling x 2 weeks. The patient was noted to have new-onset Afib this admit thought to be d/t fluid overload -- to be managed on ASA as risk outweighed the benefits (per cardiology) for long-term anticoagulation. Dopplers on 8/15 confirmed a RLE DVT (femoral vein).  Pharmacy was consulted on 01/11/13 to start heparin for anticoagulation.  Heparin level = 0.46, therapeutic on 650 units/hr IV heparin drip.  CBC stable. No bleeding issues have been noted.   CT negative for PE.  Breathing noted to be improved, no chest pain or palpitations and rate control better per cardiologist today.  Internal medicine and cardiologist indicate that patient is not a candidate for long term anticoagulation given patient's  advanced age,  the acute DVT is in the calf /asymptomatic and that age is indeterminate of R thigh DVT present; thus MD favors IVC filter.  Goal of Therapy:  Heparin level 0.3-0.7 units/ml Monitor platelets by anticoagulation protocol: Yes   Plan:  1. Continue heparin at 650 units/hr  2. Daily heparin levels, CBC 3. Will continue to monitor for any signs/symptoms of bleeding   Noah Delaine, RPh Clinical Pharmacist Pager: [phone] 01/13/2013 3:17 PM

## 2013-01-13 NOTE — Significant Event (Signed)
Attempted to wean Patient off O2 per MD request . Patient desated to 85% on RA ,placed back on 3 lit O2 N/C Patient recovered to 94%. No C/O noted will continue to monitor Patient .

## 2013-01-13 NOTE — Significant Event (Addendum)
Family member, sitting with patient , became very confrontational after demanding we get a softer nasal canula for her grandmother . Family member was informed that [facility] only had the one type of nasal canula but that I would check with charge nurse to see if we could find a softer canula. The charge nurse ,Tai ,contacted [facility] which is housed  In this hospital and who do carry a softer type of canula. [facility] let us have a canula to accommodate patient and family request and canula was placed on Patient . Family member remains confrontational and not appeased .  Charge nurse aware, Family member given 4 E Director's card.

## 2013-01-13 NOTE — Progress Notes (Signed)
Patient ID: [name], female   DOB: June 23, 1911, 79 y.o.   MRN: [id number]    Subjective:  Breathing is improved No chest pain or palpitations  Objective:  Filed Vitals:   01/12/13 0539 01/12/13 1409 01/12/13 2021 01/13/13 0627  BP: 137/76 126/61 149/71 150/61  Pulse: 103 104 90 75  Temp: 98 F (36.7 C) 98.2 F (36.8 C) 98 F (36.7 C) 98.1 F (36.7 C)  TempSrc: Axillary Oral Oral Oral  Resp: 20 20 20 20   Height:      Weight: 139 lb 12.4 oz (63.4 kg)   133 lb 1.6 oz (60.374 kg)  SpO2: 96% 96% 96% 96%    Intake/Output from previous day:  Intake/Output Summary (Last 24 hours) at 01/13/13 4098 Last data filed at 01/13/13 1191  Gross per 24 hour  Intake  865.5 ml  Output   1300 ml  Net -434.5 ml    Physical Exam: Affect appropriate Frail elderly female HEENT: Legally Blind  Neck supple with no adenopathy JVP normal no bruits no thyromegaly Lungs Decreased BS both bases no wheezing and good diaphragmatic motion Heart:  S1/S2SEM  murmur, no rub, gallop or click PMI normal Abdomen: benighn, BS positve, no tenderness, no AAA no bruit.  No HSM or HJR Distal pulses intact with no bruits No edema Neuro non-focal Skin warm and dry No muscular weakness   Lab Results: Basic Metabolic Panel:  Recent Labs  47/82/95 0600 01/13/13 0346  NA 135 137  K 3.5 3.2*  CL 95* 93*  CO2 32 35*  GLUCOSE 96 90  BUN 21 19  CREATININE 1.49* 1.48*  CALCIUM 8.3* 8.4   CBC:  Recent Labs  01/12/13 0600 01/13/13 0346  WBC 7.1 5.8  HGB 8.2* 8.2*  HCT 25.3* 25.4*  MCV 93.0 92.4  PLT 282 272    Imaging: Ct Angio Chest Pe W/cm &/or Wo Cm  01/12/2013   *RADIOLOGY REPORT*  Clinical Data: Short of breath, evaluate for PE  CT ANGIOGRAPHY CHEST  Technique:  Multidetector CT imaging of the chest using the standard protocol during bolus administration of intravenous contrast. Multiplanar reconstructed images including MIPs were obtained and reviewed to evaluate the vascular  anatomy.  Contrast: 80mL OMNIPAQUE IOHEXOL 350 MG/ML SOLN  Comparison: Chest x-ray 01/09/2013  Findings:  Mediastinum: Unremarkable CT appearance of the thyroid gland.  No suspicious mediastinal or hilar adenopathy.  No soft tissue mediastinal mass.  Small hiatal hernia.  Heart/Vascular: Excellent opacification of the pulmonary arteries to the subsegmental level.  No central filling defect to suggest acute pulmonary embolus.  Conventional three-vessel aortic arch. The ascending aorta is ectatic with a maximal diameter of 3.8 cm. Mild calcifications are noted of the aortic valve.  Cardiomegaly with biatrial enlargement.  Calcifications are noted throughout the coronary arteries.  No pericardial effusion.  Mild jugular venous distension.  Lungs/Pleura: Moderate to large bilateral layering pleural effusions.  There is associated bibasilar atelectasis.  Mild interlobular septal thickening in the apices consistent with trace edema.  No focal airspace consolidation.  Upper Abdomen: Extensive atherosclerotic calcifications of the visualized splanchnic vessels.  Otherwise, limited visualization of the upper abdomen is unremarkable.  Bones: No acute fracture or aggressive appearing lytic or blastic osseous lesion.  IMPRESSION:  1.  Negative for acute pulmonary embolus. 2.  Moderately large bilateral layering pleural effusions and associated lower lobe atelectasis. 3.  Mild pulmonary interstitial edema secondary to either volume overload or mild CHF. 4.  Cardiomegaly with biatrial enlargement. 5.  Atherosclerosis including multivessel coronary artery disease.   Original Report Authenticated By: Malachy Moan, M.D.    Cardiac Studies:  ECG:  afib rate 87 nonspecific ST/T wave changes    Telemetry: afib rates improved 01/12/2013  Echo:  EF 60-65% very mild AS  Medications:   . acetaminophen  650 mg Oral BID  . amiodarone  200 mg Oral Daily  . aspirin  81 mg Oral Daily  . cilostazol  100 mg Oral BID  . diltiazem   240 mg Oral Daily  . feeding supplement  237 mL Oral TID BM  . [START ON 01/14/2013] ferrous sulfate  325 mg Oral Q breakfast  . furosemide  80 mg Intravenous BID  . metolazone  2.5 mg Oral Daily  . metoprolol tartrate  25 mg Oral QID  . oxybutynin  5 mg Oral Daily  . potassium chloride  20 mEq Oral TID  . potassium chloride  20 mEq Oral Once  . senna  1 tablet Oral BID  . sodium chloride  3 mL Intravenous Q12H     . sodium chloride 10 mL/hr at 01/12/13 1901  . heparin 650 Units/hr (01/13/13 0740)    Assessment/Plan:  Afib:  Rate control better Not a candidate for anticoagulation or aggressive cardioversion CHF: ? Diastolic.  Continue lasix and zaroxyln  Pleural effusions more impressive on CT than CXR Will repeat CXR but initial one did not appear to be appr for thoracentesis DVT:  Since it is in femoral vein and she is not a candidate for long term anticoagulation favor IVC filter Discussed with Dr Valentina Lucks and family  Charlton Haws 01/13/2013, 8:38 AM

## 2013-01-14 ENCOUNTER — Inpatient Hospital Stay (HOSPITAL_COMMUNITY): Payer: Medicare Other

## 2013-01-14 LAB — BASIC METABOLIC PANEL
CO2: 35 mEq/L — ABNORMAL HIGH (ref 19–32)
Chloride: 95 mEq/L — ABNORMAL LOW (ref 96–112)
Potassium: 3.9 mEq/L (ref 3.5–5.1)
Sodium: 138 mEq/L (ref 135–145)

## 2013-01-14 LAB — CBC
Hemoglobin: 8.7 g/dL — ABNORMAL LOW (ref 12.0–15.0)
RBC: 2.95 MIL/uL — ABNORMAL LOW (ref 3.87–5.11)

## 2013-01-14 LAB — HEPARIN LEVEL (UNFRACTIONATED): Heparin Unfractionated: 0.38 IU/mL (ref 0.30–0.70)

## 2013-01-14 MED ORDER — FUROSEMIDE 80 MG PO TABS
80.0000 mg | ORAL_TABLET | Freq: Two times a day (BID) | ORAL | Status: DC
  Administered 2013-01-14 – 2013-01-16 (×5): 80 mg via ORAL
  Filled 2013-01-14 (×6): qty 1

## 2013-01-14 MED ORDER — MIDAZOLAM HCL 2 MG/2ML IJ SOLN
INTRAMUSCULAR | Status: AC | PRN
  Administered 2013-01-14: 0.5 mg via INTRAVENOUS

## 2013-01-14 NOTE — Progress Notes (Signed)
ANTICOAGULATION CONSULT NOTE   Pharmacy Consult for Heparin Indication: Acute RLE DVT and r/o PE  Allergies  Allergen Reactions  . Codone [Hydrocodone]     hallucinations  . Codeine Other (See Comments)    Hallucinations/abnormal behavior Per family member pt can not tolerate strong pain medications    Patient Measurements: Height: 4\' 11"  (149.9 cm) Weight: 125 lb 3.5 oz (56.8 kg) IBW/kg (Calculated) : 43.2 Heparin Dosing Weight: 56.2  Vital Signs: Temp: 98 F (36.7 C) (08/18 0454) Temp src: Oral (08/18 0454) BP: 151/78 mmHg (08/18 0949) Pulse Rate: 80 (08/18 0949)  Labs:  Recent Labs  01/12/13 0600 01/12/13 1030 01/12/13 1751 01/13/13 0346 01/14/13 0505  HGB 8.2*  --   --  8.2* 8.7*  HCT 25.3*  --   --  25.4* 27.1*  PLT 282  --   --  272 287  LABPROT  --  13.8  --   --   --   INR  --  1.08  --   --   --   HEPARINUNFRC 0.80*  --  0.52 0.46 0.38  CREATININE 1.49*  --   --  1.48* 1.57*    Estimated Creatinine Clearance: 15 ml/min (by C-G formula based on Cr of 1.57).  Assessment: 79 y.o. F with hx PVD admitted on 8/11 with ankle swelling x 2 weeks. The patient was noted to have new-onset Afib thought to be d/t fluid overload- d/t risks outweighting benefits, was going to be managed on ASA only per cards. Dopplers on 8/15 confirmed a RLE DVT in her femoral vein and IV heparin was started per pharamcy. This morning, her heparin level is 0.38, remains therapeutic on 650 units/hr IV heparin drip.  CBC stable. No bleeding issues have been noted. Cards and IM agreed pt is not a candidate for long term pharmacologic anticoagulation, and she is to go for IVC filter placement today.  Goal of Therapy:  Heparin level 0.3-0.7 units/ml Monitor platelets by anticoagulation protocol: Yes   Plan:  1. Continue heparin at 650 units/hr  2. Daily heparin level, CBC 3. Will continue to monitor for any signs/symptoms of bleeding  4. Follow up after IVC placement  Erico Stan D. Priscila Bean,  PharmD Clinical Pharmacist Pager: [phone] 01/14/2013 11:24 AM

## 2013-01-14 NOTE — Progress Notes (Signed)
Called IR regarding pt's IVC filter time.  Instructed by Fayrene Fearing that radiologist currently working with a pt and will inform us when ready and also if consent need to be signed instructed that he will find out and call back.  Will continue to monitor.  Amanda Pea, Charity fundraiser.

## 2013-01-14 NOTE — Progress Notes (Signed)
Called IR regarding pt's time for IVC filter.  Instructed by Raynelle Fanning in IR that pt's MD and IR need to clarify if pt is going to have procedure done today.  Informed Dr. Debby Bud of IVC filter order and waiting for MD to clarify time.  Instructed that he was going to call IR to speak with radiologist.  Will continue to monitor.  Amanda Pea, Charity fundraiser.

## 2013-01-14 NOTE — Progress Notes (Signed)
Attempted to get consent signed for IVC filter by Pt's grand-daughter unable to reach.  Pt's grand son at bedside also attempted to get [name].  Notified by Caryn Bee in IR unable to reach  pt's son, Makenna Macaluso aslo.  Pt's son notified and gave Kevin's phone number.   Will continue to monitor.  Amanda Pea, Charity fundraiser.

## 2013-01-14 NOTE — Progress Notes (Signed)
Patient rested quietly through the night. Son at bedside.

## 2013-01-14 NOTE — ED Notes (Signed)
Bandaid to R neck. IVC filter placed; no complications. Dr. Bonnielee Haff said to restart Heparin IV at 1800.

## 2013-01-14 NOTE — Progress Notes (Signed)
Attempted to obtained vaseline as instructed by Dr. Debby Bud for pt nostril to prevent dryness. Instructed by [facility] they don't have it in stock.  Spoke with pt again and pt stated "  Nose run at times from humidifier."   Nasal tissue provided and instructed pt and grand son at bedside to call if she need further help.  Verbalized understanding.  Will continue to monitor.  Amanda Pea, Charity fundraiser.

## 2013-01-14 NOTE — Progress Notes (Signed)
Physical Therapy Treatment Patient Details Name: [name] MRN: [id number] DOB: [birth year] Today's Date: 01/14/2013 Time: 0956-1020 PT Time Calculation (min): 24 min  PT Assessment / Plan / Recommendation  History of Present Illness 79 year-old lady with a past medical history of hypertension and peripheral vascular disease who presents today due to ankle swelling of two weeks' duration. Her peripheral vascular disease is managed by Dr. Rose Fillers; her last visit was 07/05/12. She edema is normally managed with 40 mg lasix BID. Ten days ago, she changed to 60 mg BID on the advice of her daughter, which stabilized the swelling but did not reduce swelling.   PT Comments   Pt cont's to be very weak & presents with decreased activity tolerance.  Pt states that PTA she was independent with activity, able to clean house, cook, wash dishes, walk without an AD.   Due to pt's decrease in activity tolerance & increase in assistance needed pt will need 24 hr assist for mobility at d/c.    When speaking to pt Re: if her son can provide adequate assistance it is unclear if he will be able to or not due to pt varies between yes/no answers & it is difficult to get clear picture.      Follow Up Recommendations  Home health PT;Supervision/Assistance - 24 hour     Does the patient have the potential to tolerate intense rehabilitation     Barriers to Discharge        Equipment Recommendations       Recommendations for Other Services    Frequency Min 3X/week   Progress towards PT Goals Progress towards PT goals: Progressing toward goals  Plan      Precautions / Restrictions Restrictions Weight Bearing Restrictions: No   Pertinent Vitals/Pain No pain reported.  02 sats: 95% 3L 02 at rest.  93% 3L after ambulating.      Mobility  Bed Mobility Bed Mobility: Supine to Sit;Sitting - Scoot to Edge of Bed Supine to Sit: 5: Supervision;HOB flat;With rails Sitting - Scoot to Edge of Bed: 5:  Supervision Transfers Transfers: Sit to Stand;Stand to Sit Sit to Stand: 4: Min guard;With upper extremity assist;With armrests;From bed;From chair/3-in-1 Stand to Sit: 4: Min guard;With upper extremity assist;With armrests;To chair/3-in-1 Details for Transfer Assistance: Cues for hand placement & encouragement to perform as independently as possible due to pt reaching & requesting for assistance automatically Ambulation/Gait Ambulation/Gait Assistance: 4: Min assist Ambulation Distance (Feet): 60 Feet Assistive device: None Ambulation/Gait Assistance Details: (A) for balance.  Encouragement to increase distance.  Pt fatigues quickly & seems to become anxious.  02 sats 93% 3L 02.   Gait Pattern: Step-through pattern;Decreased stride length;Shuffle;Wide base of support Gait velocity: slow General Gait Details: Pt fatigued quickly & c/o LE weakness & appeared to become anxious & fearful of falling.   Stairs: No Wheelchair Mobility Wheelchair Mobility: No       PT Goals (current goals can now be found in the care plan section) Acute Rehab PT Goals Patient Stated Goal: home PT Goal Formulation: With patient/family Time For Goal Achievement: 01/15/13 Potential to Achieve Goals: Good  Visit Information  Last PT Received On: 01/14/13 Assistance Needed: +1 History of Present Illness: 79 year-old lady with a past medical history of hypertension and peripheral vascular disease who presents today due to ankle swelling of two weeks' duration. Her peripheral vascular disease is managed by Dr. Rose Fillers; her last visit was 07/05/12. She edema is normally managed with 40  mg lasix BID. Ten days ago, she changed to 60 mg BID on the advice of her daughter, which stabilized the swelling but did not reduce swelling.    Subjective Data  Patient Stated Goal: home   Cognition  Cognition Arousal/Alertness: Awake/alert Behavior During Therapy: WFL for tasks assessed/performed Overall Cognitive Status:  Within Functional Limits for tasks assessed    Balance     End of Session PT - End of Session Equipment Utilized During Treatment: Gait belt;Oxygen Activity Tolerance: Patient tolerated treatment well;Patient limited by fatigue Patient left: in chair;with call bell/phone within reach Nurse Communication: Mobility status   GP     Lara Mulch 01/14/2013, 12:45 PM    Verdell Face, PTA [phone] 01/14/2013

## 2013-01-14 NOTE — Progress Notes (Signed)
Patient ID: [name], female   DOB: Jan 18, 1912, 79 y.o.   MRN: [id number] Request received for IVC filter placement in pt with acute RLE DVT and poor anticoagulation candidate due to age,frailty, prior diverticular bleed in 2012. Pt also legally blind and is high fall risk. She has renal insufficiency as well. Additional PMH as below. Exam: pt awake/alert; chest- sl dim BS bases ; heart- irreg irreg; abd- soft,+BS,NT; ext- no sig pedal edema.    Filed Vitals:   01/13/13 1352 01/13/13 2018 01/14/13 0454 01/14/13 0949  BP: 125/45 133/64 146/54 151/78  Pulse: 82 70 88 80  Temp: 97.7 F (36.5 C) 98.3 F (36.8 C) 98 F (36.7 C)   TempSrc: Oral Oral Oral   Resp: 20 20 18    Height:      Weight:   125 lb 3.5 oz (56.8 kg)   SpO2: 97% 97% 96%    Past Medical History  Diagnosis Date  . Peripheral vascular disease   . Peripheral edema   . COPD (chronic obstructive pulmonary disease)   . Hypertension   . Macular degeneration     bilateral  . Irritable bladder   . Rectal fissure   . Enteritis   . Duodenitis   . Esophagitis   . DJD (degenerative joint disease)   . Dyspepsia   . GERD (gastroesophageal reflux disease)    Past Surgical History  Procedure Laterality Date  . Mole excision    . Cataract surg    . Laparotomy for treatment of adhession    . Appendectomy     Dg Chest 2 View  01/13/2013   *RADIOLOGY REPORT*  Clinical Data: Pleural effusions and CHF.  CHEST - 2 VIEW  Comparison: 01/11/2013 and 01/09/2013  Findings: Two views of the chest demonstrate small bilateral pleural effusions.  There is fullness of the central vascular structures suggesting congestion.  Atherosclerotic calcifications of the aortic arch.  Heart size is upper limits normal and stable. There is fluid within the pleural fissures.  IMPRESSION: Small bilateral pleural effusions.  Central vascular congestion.   Original Report Authenticated By: Richarda Overlie, M.D.   Dg Chest 2 View  01/09/2013   *RADIOLOGY REPORT*   Clinical Data: Increased work of breathing  CHEST - 2 VIEW  Comparison: Chest radiograph 01/07/2013  Findings: Stable enlarged heart silhouette.  There is interval increase in interstitial linear markings compared to prior suggesting interstitial edema.  There is underlying chronic bronchitic change which is similar to prior.  Bilateral small pleural effusions present.  There is scarring at the lung apices.  IMPRESSION:  1.  Interval increase in interstitial edema pattern. 2.  Small pleural effusions. 3.  Underlying chronic bronchitic change and scarring.   Original Report Authenticated By: Genevive Bi, M.D.   Ct Angio Chest Pe W/cm &/or Wo Cm  01/12/2013   *RADIOLOGY REPORT*  Clinical Data: Short of breath, evaluate for PE  CT ANGIOGRAPHY CHEST  Technique:  Multidetector CT imaging of the chest using the standard protocol during bolus administration of intravenous contrast. Multiplanar reconstructed images including MIPs were obtained and reviewed to evaluate the vascular anatomy.  Contrast: 80mL OMNIPAQUE IOHEXOL 350 MG/ML SOLN  Comparison: Chest x-ray 01/09/2013  Findings:  Mediastinum: Unremarkable CT appearance of the thyroid gland.  No suspicious mediastinal or hilar adenopathy.  No soft tissue mediastinal mass.  Small hiatal hernia.  Heart/Vascular: Excellent opacification of the pulmonary arteries to the subsegmental level.  No central filling defect to suggest acute pulmonary embolus.  Conventional three-vessel aortic arch. The ascending aorta is ectatic with a maximal diameter of 3.8 cm. Mild calcifications are noted of the aortic valve.  Cardiomegaly with biatrial enlargement.  Calcifications are noted throughout the coronary arteries.  No pericardial effusion.  Mild jugular venous distension.  Lungs/Pleura: Moderate to large bilateral layering pleural effusions.  There is associated bibasilar atelectasis.  Mild interlobular septal thickening in the apices consistent with trace edema.  No focal  airspace consolidation.  Upper Abdomen: Extensive atherosclerotic calcifications of the visualized splanchnic vessels.  Otherwise, limited visualization of the upper abdomen is unremarkable.  Bones: No acute fracture or aggressive appearing lytic or blastic osseous lesion.  IMPRESSION:  1.  Negative for acute pulmonary embolus. 2.  Moderately large bilateral layering pleural effusions and associated lower lobe atelectasis. 3.  Mild pulmonary interstitial edema secondary to either volume overload or mild CHF. 4.  Cardiomegaly with biatrial enlargement. 5.  Atherosclerosis including multivessel coronary artery disease.   Original Report Authenticated By: Malachy Moan, M.D.   Portable Chest 1 View  01/07/2013   *RADIOLOGY REPORT*  Clinical Data: Heart failure.  Atrial fibrillation.  PORTABLE CHEST - 1 VIEW  Comparison: No priors.  Findings: The patient is severely rotated to the left resulting in gross distortion of the cardiomediastinal structures.  With these limitations in mind, there is a retrocardiac opacity in the base of the left hemithorax, concern for atelectasis and/or consolidation in the left lower lobe, likely with a small superimposed left pleural effusion.  Mild cephalization of the pulmonary vasculature, without frank pulmonary edema.  Heart size appears borderline to mildly enlarged.  Atherosclerosis of the thoracic aorta.  IMPRESSION: 1.  Limited examination demonstrating atelectasis and/or consolidation in the left lower lobe with superimposed small left pleural effusion. 2.  Atherosclerosis. 3.  Pulmonary venous congestion, without frank pulmonary edema.   Original Report Authenticated By: Trudie Reed, M.D.  Results for orders placed during the hospital encounter of 01/07/13  COMPREHENSIVE METABOLIC PANEL      Result Value Range   Sodium 140  135 - 145 mEq/L   Potassium 3.0 (*) 3.5 - 5.1 mEq/L   Chloride 97  96 - 112 mEq/L   CO2 30  19 - 32 mEq/L   Glucose, Bld 109 (*) 70 - 99  mg/dL   BUN 19  6 - 23 mg/dL   Creatinine, Ser 1.61 (*) 0.50 - 1.10 mg/dL   Calcium 9.2  8.4 - 09.6 mg/dL   Total Protein 6.7  6.0 - 8.3 g/dL   Albumin 3.5  3.5 - 5.2 g/dL   AST 17  0 - 37 U/L   ALT 10  0 - 35 U/L   Alkaline Phosphatase 64  39 - 117 U/L   Total Bilirubin 0.3  0.3 - 1.2 mg/dL   GFR calc non Af Amer 29 (*) >90 mL/min   GFR calc Af Amer 34 (*) >90 mL/min  MAGNESIUM      Result Value Range   Magnesium 2.1  1.5 - 2.5 mg/dL  LACTATE DEHYDROGENASE, ISOENZYMES      Result Value Range   LDH 1 27  19  - 38 %   LDH 2 30  30  - 43 %   LDH 3 23  16  - 26 %   LDH 4 10  3  - 12 %   LDH 5 10  3  - 14 %   LDH Isoenzymes, Total 185  120 - 250 U/L   LD1/LD2 Ratio 0.89  TROPONIN I      Result Value Range   Troponin I <0.30  <0.30 ng/mL  TROPONIN I      Result Value Range   Troponin I <0.30  <0.30 ng/mL  TROPONIN I      Result Value Range   Troponin I <0.30  <0.30 ng/mL  PRO B NATRIURETIC PEPTIDE      Result Value Range   Pro B Natriuretic peptide (BNP) 2131.0 (*) 0 - 450 pg/mL  BASIC METABOLIC PANEL      Result Value Range   Sodium 141  135 - 145 mEq/L   Potassium 3.1 (*) 3.5 - 5.1 mEq/L   Chloride 99  96 - 112 mEq/L   CO2 31  19 - 32 mEq/L   Glucose, Bld 114 (*) 70 - 99 mg/dL   BUN 18  6 - 23 mg/dL   Creatinine, Ser 2.13 (*) 0.50 - 1.10 mg/dL   Calcium 9.0  8.4 - 08.6 mg/dL   GFR calc non Af Amer 28 (*) >90 mL/min   GFR calc Af Amer 33 (*) >90 mL/min  BASIC METABOLIC PANEL      Result Value Range   Sodium 140  135 - 145 mEq/L   Potassium 3.2 (*) 3.5 - 5.1 mEq/L   Chloride 100  96 - 112 mEq/L   CO2 29  19 - 32 mEq/L   Glucose, Bld 106 (*) 70 - 99 mg/dL   BUN 18  6 - 23 mg/dL   Creatinine, Ser 5.78 (*) 0.50 - 1.10 mg/dL   Calcium 8.4  8.4 - 46.9 mg/dL   GFR calc non Af Amer 28 (*) >90 mL/min   GFR calc Af Amer 32 (*) >90 mL/min  PRO B NATRIURETIC PEPTIDE      Result Value Range   Pro B Natriuretic peptide (BNP) 1751.0 (*) 0 - 450 pg/mL  BASIC METABOLIC PANEL       Result Value Range   Sodium 139  135 - 145 mEq/L   Potassium 3.6  3.5 - 5.1 mEq/L   Chloride 99  96 - 112 mEq/L   CO2 30  19 - 32 mEq/L   Glucose, Bld 134 (*) 70 - 99 mg/dL   BUN 19  6 - 23 mg/dL   Creatinine, Ser 6.29 (*) 0.50 - 1.10 mg/dL   Calcium 8.3 (*) 8.4 - 10.5 mg/dL   GFR calc non Af Amer 28 (*) >90 mL/min   GFR calc Af Amer 33 (*) >90 mL/min  BASIC METABOLIC PANEL      Result Value Range   Sodium 139  135 - 145 mEq/L   Potassium 4.5  3.5 - 5.1 mEq/L   Chloride 100  96 - 112 mEq/L   CO2 30  19 - 32 mEq/L   Glucose, Bld 138 (*) 70 - 99 mg/dL   BUN 21  6 - 23 mg/dL   Creatinine, Ser 5.28 (*) 0.50 - 1.10 mg/dL   Calcium 8.3 (*) 8.4 - 10.5 mg/dL   GFR calc non Af Amer 28 (*) >90 mL/min   GFR calc Af Amer 32 (*) >90 mL/min  PRO B NATRIURETIC PEPTIDE      Result Value Range   Pro B Natriuretic peptide (BNP) 1845.0 (*) 0 - 450 pg/mL  BASIC METABOLIC PANEL      Result Value Range   Sodium 135  135 - 145 mEq/L   Potassium 3.5  3.5 - 5.1 mEq/L   Chloride 95 (*) 96 -  112 mEq/L   CO2 32  19 - 32 mEq/L   Glucose, Bld 96  70 - 99 mg/dL   BUN 21  6 - 23 mg/dL   Creatinine, Ser 1.61 (*) 0.50 - 1.10 mg/dL   Calcium 8.3 (*) 8.4 - 10.5 mg/dL   GFR calc non Af Amer 28 (*) >90 mL/min   GFR calc Af Amer 32 (*) >90 mL/min  CBC      Result Value Range   WBC 7.1  4.0 - 10.5 K/uL   RBC 3.04 (*) 3.87 - 5.11 MIL/uL   Hemoglobin 9.1 (*) 12.0 - 15.0 g/dL   HCT 09.6 (*) 04.5 - 40.9 %   MCV 93.1  78.0 - 100.0 fL   MCH 29.9  26.0 - 34.0 pg   MCHC 32.2  30.0 - 36.0 g/dL   RDW 81.1  91.4 - 78.2 %   Platelets 311  150 - 400 K/uL  CBC      Result Value Range   WBC 7.1  4.0 - 10.5 K/uL   RBC 2.72 (*) 3.87 - 5.11 MIL/uL   Hemoglobin 8.2 (*) 12.0 - 15.0 g/dL   HCT 95.6 (*) 21.3 - 08.6 %   MCV 93.0  78.0 - 100.0 fL   MCH 30.1  26.0 - 34.0 pg   MCHC 32.4  30.0 - 36.0 g/dL   RDW 57.8  46.9 - 62.9 %   Platelets 282  150 - 400 K/uL  HEPARIN LEVEL (UNFRACTIONATED)      Result Value Range    Heparin Unfractionated 0.80 (*) 0.30 - 0.70 IU/mL  PRO B NATRIURETIC PEPTIDE      Result Value Range   Pro B Natriuretic peptide (BNP) 2078.0 (*) 0 - 450 pg/mL  HEPARIN LEVEL (UNFRACTIONATED)      Result Value Range   Heparin Unfractionated 0.52  0.30 - 0.70 IU/mL  IRON AND TIBC      Result Value Range   Iron 24 (*) 42 - 135 ug/dL   TIBC 528  413 - 244 ug/dL   Saturation Ratios 8 (*) 20 - 55 %   UIBC 288  125 - 400 ug/dL  FERRITIN      Result Value Range   Ferritin 22  10 - 291 ng/mL  PROTIME-INR      Result Value Range   Prothrombin Time 13.8  11.6 - 15.2 seconds   INR 1.08  0.00 - 1.49  CBC      Result Value Range   WBC 5.8  4.0 - 10.5 K/uL   RBC 2.75 (*) 3.87 - 5.11 MIL/uL   Hemoglobin 8.2 (*) 12.0 - 15.0 g/dL   HCT 01.0 (*) 27.2 - 53.6 %   MCV 92.4  78.0 - 100.0 fL   MCH 29.8  26.0 - 34.0 pg   MCHC 32.3  30.0 - 36.0 g/dL   RDW 64.4  03.4 - 74.2 %   Platelets 272  150 - 400 K/uL  HEPARIN LEVEL (UNFRACTIONATED)      Result Value Range   Heparin Unfractionated 0.46  0.30 - 0.70 IU/mL  BASIC METABOLIC PANEL      Result Value Range   Sodium 137  135 - 145 mEq/L   Potassium 3.2 (*) 3.5 - 5.1 mEq/L   Chloride 93 (*) 96 - 112 mEq/L   CO2 35 (*) 19 - 32 mEq/L   Glucose, Bld 90  70 - 99 mg/dL   BUN 19  6 - 23 mg/dL  Creatinine, Ser 1.48 (*) 0.50 - 1.10 mg/dL   Calcium 8.4  8.4 - 46.9 mg/dL   GFR calc non Af Amer 28 (*) >90 mL/min   GFR calc Af Amer 32 (*) >90 mL/min  CBC      Result Value Range   WBC 5.9  4.0 - 10.5 K/uL   RBC 2.95 (*) 3.87 - 5.11 MIL/uL   Hemoglobin 8.7 (*) 12.0 - 15.0 g/dL   HCT 62.9 (*) 52.8 - 41.3 %   MCV 91.9  78.0 - 100.0 fL   MCH 29.5  26.0 - 34.0 pg   MCHC 32.1  30.0 - 36.0 g/dL   RDW 24.4  01.0 - 27.2 %   Platelets 287  150 - 400 K/uL  HEPARIN LEVEL (UNFRACTIONATED)      Result Value Range   Heparin Unfractionated 0.38  0.30 - 0.70 IU/mL  BASIC METABOLIC PANEL      Result Value Range   Sodium 138  135 - 145 mEq/L   Potassium 3.9   3.5 - 5.1 mEq/L   Chloride 95 (*) 96 - 112 mEq/L   CO2 35 (*) 19 - 32 mEq/L   Glucose, Bld 98  70 - 99 mg/dL   BUN 20  6 - 23 mg/dL   Creatinine, Ser 5.36 (*) 0.50 - 1.10 mg/dL   Calcium 9.2  8.4 - 64.4 mg/dL   GFR calc non Af Amer 26 (*) >90 mL/min   GFR calc Af Amer 30 (*) >90 mL/min   A/P: Pt with acute RLE DVT, renal insuff, poor anticoagulation candidate due to age/frailty/legally blind/elevated fall risk/prior diverticular bleed 2012. Plan is for IVC filter placement today utilizing CO2 . Details of above d/w pt/grandson / granddaughter ( POA Cynda Familia) with their understanding and consent.

## 2013-01-14 NOTE — Progress Notes (Signed)
Subjective: [name] is lying in bed.  She has no complaints.  She admits to being cold and having an occasional cough. Denies chest pain or palpitations.  Objective:    Recent Labs  01/12/13 0600 01/13/13 0346 01/14/13 0505  WBC 7.1 5.8 5.9  HGB 8.2* 8.2* 8.7*  HCT 25.3* 25.4* 27.1*  MCV 93.0 92.4 91.9  PLT 282 272 287    Recent Labs  01/12/13 0600 01/13/13 0346 01/14/13 0505  NA 135 137 138  K 3.5 3.2* 3.9  CL 95* 93* 95*  GLUCOSE 96 90 98  BUN 21 19 20   CREATININE 1.49* 1.48* 1.57*  CALCIUM 8.3* 8.4 9.2    Imaging:  LE Venous Duplex (01/11/13) Summary: - Findings consistent with indeterminate age deep vein thrombosis involving the right femoral vein. - Acute deep vein thrombosis involving the right peroneal vein. - No evidence of Baker's cyst on the right.   CT ANGIOGRAPHY CHEST (01/11/13) IMPRESSION:  1. Negative for acute pulmonary embolus.  2. Moderately large bilateral layering pleural effusions and  associated lower lobe atelectasis.  3. Mild pulmonary interstitial edema secondary to either volume  overload or mild CHF.  4. Cardiomegaly with biatrial enlargement.  5. Atherosclerosis including multivessel coronary artery disease.   CXR Chest 2 View (01/13/13) Findings: Two views of the chest demonstrate small bilateral  pleural effusions. There is fullness of the central vascular  structures suggesting congestion. Atherosclerotic calcifications  of the aortic arch. Heart size is upper limits normal and stable.  There is fluid within the pleural fissures.  IMPRESSION:  Small bilateral pleural effusions.   Scheduled Meds: . acetaminophen  650 mg Oral BID  . amiodarone  200 mg Oral Daily  . aspirin  81 mg Oral Daily  . cilostazol  100 mg Oral BID  . diltiazem  240 mg Oral Daily  . feeding supplement  237 mL Oral TID BM  . ferrous sulfate  325 mg Oral Q breakfast  . furosemide  80 mg Intravenous BID  . metolazone  2.5 mg Oral Daily  .  metoprolol tartrate  25 mg Oral QID  . oxybutynin  5 mg Oral Daily  . potassium chloride  20 mEq Oral TID  . senna  1 tablet Oral BID  . sodium chloride  3 mL Intravenous Q12H   Continuous Infusions: . sodium chloride 10 mL/hr at 01/12/13 1901  . heparin 650 Units/hr (01/13/13 0740)   PRN Meds:.bisacodyl   Physical Exam: Filed Vitals:   01/14/13 0454  BP: 146/54  Pulse: 88  Temp: 98 F (36.7 C)  Resp: 18    Intake/Output Summary (Last 24 hours) at 01/14/13 0755 Last data filed at 01/14/13 0442  Gross per 24 hour  Intake 984.17 ml  Output    700 ml  Net 284.17 ml   I/O last 3 completed shifts: In: 1303.7 [P.O.:708; I.V.:595.7] Out: 2000 [Urine:2000]  Gen'l- Elderly lady bundled up and lying in bed. HEENT- blind Neck - no JVD in the sitting position Cor - 90-110 on tele Pulm - Diminished breath sounds at the bases, bibasilar rales, end-expiratory wheezes Neuro - A&IO x 3  Assessment/Plan: 1. A Fib persists despite low-dose amiodarone.  Rate is better controlled - currently on diltiazem 240 and lopressor 25 qid.  Plan Continue present regimen  2. CHF -  WOB has decreased, poor diuresis BNP (last 3 results)  Recent Labs  01/09/13 0505 01/11/13 0645 01/12/13 0600  PROBNP 1751.0* 1845.0* 2078.0*   Plan Continue zaroxyln  2.5 mg qAM  Continue furosemide  3. HTN-  BP Readings from Last 3 Encounters:  01/14/13 146/54  01/07/13 134/60  07/05/12 155/54  Stable  4. Renal    Kidney function markedly decreased  Plan Monitor closely  Recheck BNP on Wednesday, 20 August  Dispo - home with [facility] when ready for d/c.  [name] 01/14/2013, 7:55 AM

## 2013-01-14 NOTE — Progress Notes (Signed)
Notified by Caryn Bee that consent for IVC filter just obtained from pt's grand daughter.  Called James, in IR and notified him heparin is going to be turned off as  Was instructed by Caryn Bee for an hour prior to procedure.  Pt made aware.  Amanda Pea, Charity fundraiser.

## 2013-01-14 NOTE — Progress Notes (Signed)
    SUBJECTIVE: Breathing is better. No chest pain.   BP 151/78  Pulse 80  Temp(Src) 98 F (36.7 C) (Oral)  Resp 18  Ht 4\' 11"  (1.499 m)  Wt 125 lb 3.5 oz (56.8 kg)  BMI 25.28 kg/m2  SpO2 96%  Intake/Output Summary (Last 24 hours) at 01/14/13 1113 Last data filed at 01/14/13 0925  Gross per 24 hour  Intake 984.17 ml  Output   1350 ml  Net -365.83 ml    PHYSICAL EXAM General: Well developed, well nourished, in no acute distress. Alert and oriented x 3.  Psych:  Good affect, responds appropriately Neck: No JVD. No masses noted.  Lungs: Clear bilaterally with no wheezes or rhonci noted.  Heart: Irreg irreg with no murmurs noted. Abdomen: Bowel sounds are present. Soft, non-tender.  Extremities: No lower extremity edema.   LABS: Basic Metabolic Panel:  Recent Labs  16/10/96 0346 01/14/13 0505  NA 137 138  K 3.2* 3.9  CL 93* 95*  CO2 35* 35*  GLUCOSE 90 98  BUN 19 20  CREATININE 1.48* 1.57*  CALCIUM 8.4 9.2   CBC:  Recent Labs  01/13/13 0346 01/14/13 0505  WBC 5.8 5.9  HGB 8.2* 8.7*  HCT 25.4* 27.1*  MCV 92.4 91.9  PLT 272 287   Current Meds: . acetaminophen  650 mg Oral BID  . amiodarone  200 mg Oral Daily  . aspirin  81 mg Oral Daily  . cilostazol  100 mg Oral BID  . diltiazem  240 mg Oral Daily  . feeding supplement  237 mL Oral TID BM  . ferrous sulfate  325 mg Oral Q breakfast  . furosemide  80 mg Intravenous BID  . metolazone  2.5 mg Oral Daily  . metoprolol tartrate  25 mg Oral QID  . oxybutynin  5 mg Oral Daily  . potassium chloride  20 mEq Oral TID  . senna  1 tablet Oral BID  . sodium chloride  3 mL Intravenous Q12H   Echo 01/08/13: Left ventricle: The cavity size was normal. Wall thickness was increased in a pattern of mild LVH. Systolic function was normal. The estimated ejection fraction was in the range of 60% to 65%. Wall motion was normal; there were no regional wall motion abnormalities. Doppler parameters are consistent with  high ventricular filling pressure. - Aortic valve: There was very mild stenosis. Mild regurgitation. Valve area: 1.57cm^2(VTI). Valve area: 1.81cm^2 (Vmax). - Mitral valve: Calcified annulus. Mild regurgitation. - Left atrium: The atrium was mildly dilated. - Right atrium: The atrium was moderately dilated. - Tricuspid valve: Moderate regurgitation. - Pulmonary arteries: PA peak pressure: 44mm Hg (S).  ASSESSMENT AND PLAN:  1. Atrial fibrillation: Rate controlled. Continue current meds including amiodarone, metoprolol, cardizem. Not a good candidate for long term anti-coagulation.   2. Acute diastolic CHF: Volume status appears to be better. Her renal function is slightly worsened. Would consider changing to po Lasix today. Would repeat CXR in am.   3. DVT: Not a good candidate for anti-coagulation long term. Plans in place for IVC filter per primary team.   [name]  8/18/201411:13 AM

## 2013-01-14 NOTE — Procedures (Signed)
IVC filter No comp 

## 2013-01-14 NOTE — Progress Notes (Signed)
Return call received from pt's grand-daughter stating that she had gotten a call from [place] in IR but was asleep.  Gave her Caryn Bee number to be reached.   Amanda Pea, Charity fundraiser.

## 2013-01-14 NOTE — Progress Notes (Signed)
Pt back from IVC filter with Rt. Neck filter band aid D&I.  Food service notified and will send pt a meal tray.  Valene Bors at bedside.  Will continue to monitor.  Amanda Pea, Charity fundraiser.

## 2013-01-15 ENCOUNTER — Inpatient Hospital Stay (HOSPITAL_COMMUNITY): Payer: Medicare Other

## 2013-01-15 LAB — BASIC METABOLIC PANEL
BUN: 24 mg/dL — ABNORMAL HIGH (ref 6–23)
CO2: 36 mEq/L — ABNORMAL HIGH (ref 19–32)
Chloride: 94 mEq/L — ABNORMAL LOW (ref 96–112)
Creatinine, Ser: 1.72 mg/dL — ABNORMAL HIGH (ref 0.50–1.10)

## 2013-01-15 LAB — CBC
HCT: 29.9 % — ABNORMAL LOW (ref 36.0–46.0)
MCV: 92.6 fL (ref 78.0–100.0)
RBC: 3.23 MIL/uL — ABNORMAL LOW (ref 3.87–5.11)
WBC: 5.9 10*3/uL (ref 4.0–10.5)

## 2013-01-15 LAB — HEPARIN LEVEL (UNFRACTIONATED): Heparin Unfractionated: 0.12 IU/mL — ABNORMAL LOW (ref 0.30–0.70)

## 2013-01-15 NOTE — Plan of Care (Signed)
Problem: Phase III Progression Outcomes Goal: Activity at appropriate level-compared to baseline (UP IN CHAIR FOR HEMODIALYSIS)  Outcome: Progressing Ambulating with staff to [facility] & PT ambulating pt.

## 2013-01-15 NOTE — Progress Notes (Signed)
Spoke with patient son/POA at bedside today to explain [facility] services.  He is not opposed with services, but would like the patient's grand daughter Rosey Bath to review the information for him prior to engagement.  Informed him that he or his grand daughter could contact our office or Clinical research associate for services and questions.  Of note, [facility] Care Management services does not replace or interfere with any services that are arranged by inpatient case management or social work.  For additional questions or referrals please contact Anibal Henderson BSN RN [facility] Liaison at [phone].

## 2013-01-15 NOTE — Progress Notes (Signed)
ANTICOAGULATION CONSULT NOTE - Follow Up Consult  Pharmacy Consult for heparin Indication: Acute RLE DVT and r/o PE s/p IVC  Allergies  Allergen Reactions  . Codone [Hydrocodone]     hallucinations  . Codeine Other (See Comments)    Hallucinations/abnormal behavior Per family member pt can not tolerate strong pain medications    Patient Measurements: Height: 4\' 11"  (149.9 cm) Weight: 115 lb 3.2 oz (52.254 kg) ( no standing wt) IBW/kg (Calculated) : 43.2 Heparin Dosing Weight: 56.2kg  Vital Signs: Temp: 98 F (36.7 C) (08/19 0606) Temp src: Oral (08/19 0606) BP: 127/65 mmHg (08/19 0937) Pulse Rate: 101 (08/19 0937)  Labs:  Recent Labs  01/12/13 1030  01/13/13 0346 01/14/13 0505 01/15/13 0100 01/15/13 0500 01/15/13 0816  HGB  --   < > 8.2* 8.7*  --  9.5*  --   HCT  --   --  25.4* 27.1*  --  29.9*  --   PLT  --   --  272 287  --  300  --   LABPROT 13.8  --   --   --   --   --   --   INR 1.08  --   --   --   --   --   --   HEPARINUNFRC  --   < > 0.46 0.38 0.12*  --  0.35  CREATININE  --   --  1.48* 1.57*  --  1.72*  --   < > = values in this interval not displayed.  Estimated Creatinine Clearance: 13.2 ml/min (by C-G formula based on Cr of 1.72).   Assessment: 54 YOF who continues on heparin after placement of IVC filter yesterday afternoon. Heparin was held for procedure correctly and then restarted last evening. The first heparin level obtained after restart was drawn too early, and therefore no changes were made. A repeat level drawn at the correct time was therapeutic at 0.35. No bleeding noted. CBC up slightly.  Goal of Therapy:  Heparin level 0.3-0.7 units/ml Monitor platelets by anticoagulation protocol: Yes   Plan:  1. Continue heparin at 650units/hr 2. Confirmatory level at 1600 3. Follow up disposition plans and timing of drip stopping since IVC now placed  Gretna Bergin D. Yuriy Cui, PharmD Clinical Pharmacist Pager: [phone] 01/15/2013 10:06 AM

## 2013-01-15 NOTE — Progress Notes (Signed)
    SUBJECTIVE: No chest pain. Breathing improved.   BP 144/77  Pulse 106  Temp(Src) 98 F (36.7 C) (Oral)  Resp 19  Ht 4\' 11"  (1.499 m)  Wt 115 lb 3.2 oz (52.254 kg)  BMI 23.25 kg/m2  SpO2 100%  Intake/Output Summary (Last 24 hours) at 01/15/13 0802 Last data filed at 01/15/13 1610  Gross per 24 hour  Intake      0 ml  Output   3025 ml  Net  -3025 ml    PHYSICAL EXAM General: Well developed, well nourished, in no acute distress. Alert and oriented x 3.  Psych:  Good affect, responds appropriately Neck: No JVD. No masses noted.  Lungs: Clear bilaterally with no wheezes or rhonci noted.  Heart: RRR with no murmurs noted. Abdomen: Bowel sounds are present. Soft, non-tender.  Extremities: No lower extremity edema.   LABS: Basic Metabolic Panel:  Recent Labs  96/04/54 0505 01/15/13 0500  NA 138 139  K 3.9 4.6  CL 95* 94*  CO2 35* 36*  GLUCOSE 98 96  BUN 20 24*  CREATININE 1.57* 1.72*  CALCIUM 9.2 9.6   CBC:  Recent Labs  01/14/13 0505 01/15/13 0500  WBC 5.9 5.9  HGB 8.7* 9.5*  HCT 27.1* 29.9*  MCV 91.9 92.6  PLT 287 300   Current Meds: . acetaminophen  650 mg Oral BID  . amiodarone  200 mg Oral Daily  . aspirin  81 mg Oral Daily  . cilostazol  100 mg Oral BID  . diltiazem  240 mg Oral Daily  . feeding supplement  237 mL Oral TID BM  . ferrous sulfate  325 mg Oral Q breakfast  . furosemide  80 mg Oral BID  . metolazone  2.5 mg Oral Daily  . metoprolol tartrate  25 mg Oral QID  . oxybutynin  5 mg Oral Daily  . potassium chloride  20 mEq Oral TID  . senna  1 tablet Oral BID  . sodium chloride  3 mL Intravenous Q12H    ASSESSMENT AND PLAN:   1. Atrial fibrillation: Rate controlled. Continue current meds including amiodarone, metoprolol, cardizem. Not a good candidate for long term anti-coagulation.   2. Acute diastolic CHF: Her renal function is worsened. Probably near baseline in regards to intravascular volume. Consider stopping metolazone  today and may need to stop Lasix tomorrow if renal function worsened.   3. DVT: Not a good candidate for anti-coagulation long term. IVC filter yesterday.       [name]  8/19/20148:02 AM

## 2013-01-15 NOTE — Progress Notes (Signed)
Noted order to d/c pt's oxygen.  Notified Dr. Filbert Schilder pt's 02 sat 83-88 on RA.  02 at 3l placed back on pt.  Son at bedside made aware.  Will continue to monitor.  Amanda Pea, Charity fundraiser.

## 2013-01-15 NOTE — Progress Notes (Signed)
Subjective: Ms [name] is bundled up and lying in bed.  She states that her neck is from the procedure yesterday.  She denies any cough, chest pain or palpitations.  Objective:  Recent Labs  01/13/13 0346 01/14/13 0505 01/15/13 0500  WBC 5.8 5.9 5.9  HGB 8.2* 8.7* 9.5*  HCT 25.4* 27.1* 29.9*  MCV 92.4 91.9 92.6  PLT 272 287 300    Recent Labs  01/13/13 0346 01/14/13 0505 01/15/13 0500  NA 137 138 139  K 3.2* 3.9 4.6  CL 93* 95* 94*  GLUCOSE 90 98 96  BUN 19 20 24*  CREATININE 1.48* 1.57* 1.72*  CALCIUM 8.4 9.2 9.6    Imaging (no new imaging today):  CXR Chest 2 View (01/13/13) Findings: Two views of the chest demonstrate small bilateral  pleural effusions. There is fullness of the central vascular  structures suggesting congestion. Atherosclerotic calcifications  of the aortic arch. Heart size is upper limits normal and stable.  There is fluid within the pleural fissures.  IMPRESSION:  Small bilateral pleural effusions.   LE Venous Duplex (01/11/13) Summary: - Findings consistent with indeterminate age deep vein thrombosis involving the right femoral vein. - Acute deep vein thrombosis involving the right peroneal vein. - No evidence of Baker's cyst on the right.   CT ANGIOGRAPHY CHEST (01/11/13) IMPRESSION:  1. Negative for acute pulmonary embolus.  2. Moderately large bilateral layering pleural effusions and  associated lower lobe atelectasis.  3. Mild pulmonary interstitial edema secondary to either volume  overload or mild CHF.  4. Cardiomegaly with biatrial enlargement.  5. Atherosclerosis including multivessel coronary artery disease.   Scheduled Meds: . acetaminophen  650 mg Oral BID  . amiodarone  200 mg Oral Daily  . aspirin  81 mg Oral Daily  . cilostazol  100 mg Oral BID  . diltiazem  240 mg Oral Daily  . feeding supplement  237 mL Oral TID BM  . ferrous sulfate  325 mg Oral Q breakfast  . furosemide  80 mg Oral BID  . metolazone  2.5 mg  Oral Daily  . metoprolol tartrate  25 mg Oral QID  . oxybutynin  5 mg Oral Daily  . potassium chloride  20 mEq Oral TID  . senna  1 tablet Oral BID  . sodium chloride  3 mL Intravenous Q12H   Continuous Infusions: . sodium chloride 10 mL/hr at 01/12/13 1901  . heparin 650 Units/hr (01/15/13 0212)   PRN Meds:.bisacodyl   Physical Exam: Filed Vitals:   01/15/13 0606  BP: 144/77  Pulse: 106  Temp: 98 F (36.7 C)  Resp: 19    Intake/Output Summary (Last 24 hours) at 01/15/13 0710 Last data filed at 01/15/13 0981  Gross per 24 hour  Intake      0 ml  Output   3025 ml  Net  -3025 ml   Total this adm: -3,700  Gen'l- Elderly lady bundled up and lying in bed. HEENT- blind Neck - no JVD in the seated position. Cor - 80-95 on tele, IRIR Pulm - Slightly diminished breath sounds at the bases with bibasalar rales Neuro - A&O x3 Extremities - Reduced edema in LE  Assessment/Plan: 1. A Fib persists despite low-dose amiodarone.  Rate is better controlled - currently on diltiazem 240 and lopressor 25 qid.  Plan Continue present regimen  2. CHF -  WOB has decreased BNP (last 3 results)  Recent Labs  01/09/13 0505 01/11/13 0645 01/12/13 0600  PROBNP 1751.0* 1845.0* 2078.0*  Plan Continue zaroxyln 2.5 mg qAM  Continue furosemide 80 mg PO BID  Trial off nasal canaula   Re-Check BNP tomorrow (20 August)  Redo CXR tomorrow (20 August)  3. HTN-  BP Readings from Last 3 Encounters:  01/15/13 144/77  01/07/13 134/60  07/05/12 155/54  Stable  4. Renal    Kidney function markedly decreased  Plan Monitor closely  Recheck BNP on Wednesday, 20 August  Dispo - home with [facility] when ready for d/c.  [name] 01/15/2013, 7:10 AM

## 2013-01-15 NOTE — Progress Notes (Signed)
ANTICOAGULATION CONSULT NOTE - Follow Up Consult  Pharmacy Consult for heparin Indication: DVT and r/o PE  Labs:  Recent Labs  01/12/13 0600 01/12/13 1030  01/13/13 0346 01/14/13 0505 01/15/13 0100  HGB 8.2*  --   --  8.2* 8.7*  --   HCT 25.3*  --   --  25.4* 27.1*  --   PLT 282  --   --  272 287  --   LABPROT  --  13.8  --   --   --   --   INR  --  1.08  --   --   --   --   HEPARINUNFRC 0.80*  --   < > 0.46 0.38 0.12*  CREATININE 1.49*  --   --  1.48* 1.57*  --   < > = values in this interval not displayed.   Assessment/Plan:  79yo female subtherapeutic on heparin after resumed post-IVC placement though lab drawn 5hr after resumed and was previously therapeutic at this rate.  Will continue gtt at current rate for now and check level again.  Vernard Gambles, PharmD, BCPS  01/15/2013,2:58 AM

## 2013-01-16 DIAGNOSIS — Z515 Encounter for palliative care: Secondary | ICD-10-CM

## 2013-01-16 LAB — CBC
MCV: 90.1 fL (ref 78.0–100.0)
Platelets: 283 10*3/uL (ref 150–400)
RBC: 3.42 MIL/uL — ABNORMAL LOW (ref 3.87–5.11)
WBC: 6.8 10*3/uL (ref 4.0–10.5)

## 2013-01-16 LAB — BASIC METABOLIC PANEL
CO2: 36 mEq/L — ABNORMAL HIGH (ref 19–32)
Chloride: 93 mEq/L — ABNORMAL LOW (ref 96–112)
Creatinine, Ser: 1.8 mg/dL — ABNORMAL HIGH (ref 0.50–1.10)
Potassium: 4.1 mEq/L (ref 3.5–5.1)

## 2013-01-16 LAB — GLUCOSE, CAPILLARY: Glucose-Capillary: 119 mg/dL — ABNORMAL HIGH (ref 70–99)

## 2013-01-16 MED ORDER — DILTIAZEM HCL ER COATED BEADS 240 MG PO CP24: 240.0000 mg | ORAL_CAPSULE | Freq: Every day | ORAL | Status: AC

## 2013-01-16 MED ORDER — POTASSIUM CHLORIDE CRYS ER 20 MEQ PO TBCR: 20.0000 meq | EXTENDED_RELEASE_TABLET | Freq: Every day | ORAL | Status: AC

## 2013-01-16 MED ORDER — METOPROLOL TARTRATE 50 MG PO TABS: 50.0000 mg | ORAL_TABLET | Freq: Two times a day (BID) | ORAL | Status: AC

## 2013-01-16 MED ORDER — SENNA 8.6 MG PO TABS: 1.0000 | ORAL_TABLET | Freq: Two times a day (BID) | ORAL | Status: AC

## 2013-01-16 MED ORDER — ENSURE COMPLETE PO LIQD: 237.0000 mL | Freq: Three times a day (TID) | ORAL | Status: DC

## 2013-01-16 NOTE — Progress Notes (Signed)
    SUBJECTIVE: No chest pain or SOB  BP 125/75  Pulse 86  Temp(Src) 97.6 F (36.4 C) (Oral)  Resp 18  Ht 4\' 11"  (1.499 m)  Wt 117 lb 3.2 oz (53.162 kg)  BMI 23.66 kg/m2  SpO2 99%  Intake/Output Summary (Last 24 hours) at 01/16/13 1610 Last data filed at 01/16/13 9604  Gross per 24 hour  Intake    841 ml  Output   1326 ml  Net   -485 ml    PHYSICAL EXAM General: Elderly, cachectic female, NAD, Alert and oriented x 3.  Psych:  Good affect, responds appropriately Neck: No JVD. No masses noted.  Lungs: Clear bilaterally with no wheezes or rhonci noted.  Heart: Irreg irreg with no murmurs noted. Abdomen: Bowel sounds are present. Soft, non-tender.  Extremities: No lower extremity edema.   LABS: Basic Metabolic Panel:  Recent Labs  54/09/81 0500 01/16/13 0500  NA 139 140  K 4.6 4.1  CL 94* 93*  CO2 36* 36*  GLUCOSE 96 98  BUN 24* 28*  CREATININE 1.72* 1.80*  CALCIUM 9.6 9.8   CBC:  Recent Labs  01/15/13 0500 01/16/13 0500  WBC 5.9 6.8  HGB 9.5* 10.2*  HCT 29.9* 30.8*  MCV 92.6 90.1  PLT 300 283   Current Meds: . acetaminophen  650 mg Oral BID  . amiodarone  200 mg Oral Daily  . aspirin  81 mg Oral Daily  . cilostazol  100 mg Oral BID  . diltiazem  240 mg Oral Daily  . feeding supplement  237 mL Oral TID BM  . ferrous sulfate  325 mg Oral Q breakfast  . furosemide  80 mg Oral BID  . metoprolol tartrate  25 mg Oral QID  . oxybutynin  5 mg Oral Daily  . potassium chloride  20 mEq Oral TID  . senna  1 tablet Oral BID  . sodium chloride  3 mL Intravenous Q12H     ASSESSMENT AND PLAN:  1. Atrial fibrillation: Rate controlled. Continue current meds including amiodarone, metoprolol, cardizem. Not a good candidate for long term anti-coagulation.   2. Acute diastolic CHF: She appears to be at baseline in regards to intravascular volume. Her renal function is above baseline but overall stable over last 24 hours. Her episode of diastolic failure likely  triggered by rapid ventricular response with atrial fibrillation. Could probably get her back on home dose of Lasix which is 40 mg po BID.   3. DVT: Not a good candidate for anti-coagulation long term. IVC filter placed by IR 01/14/13.  Long discussion with pt and granddaughter with Dr. Debby Bud. Will plan d/c later today or tomorrow with plan for Hospice/Palliation.     [name]  8/20/20147:22 AM

## 2013-01-16 NOTE — Progress Notes (Signed)
Pt is being transported home by her granddaughter. Pt has received discharge instructions. RN answered all questions the patient and granddaughter had.

## 2013-01-16 NOTE — Progress Notes (Signed)
The patient stated that she was feeling better and she did not any complaints of pain or acute changes overnight.

## 2013-01-16 NOTE — Progress Notes (Signed)
Physical Therapy Treatment Patient Details Name: ARYELLA BESECKER MRN: [id number] DOB: May 02, 1912 Today's Date: 01/16/2013 Time: 4098-1191 PT Time Calculation (min): 13 min  PT Assessment / Plan / Recommendation  History of Present Illness 79 year-old lady with a past medical history of hypertension and peripheral vascular disease who presents today due to ankle swelling of two weeks' duration. Her peripheral vascular disease is managed by Dr. Rose Fillers; her last visit was 07/05/12. She edema is normally managed with 40 mg lasix BID. Ten days ago, she changed to 60 mg BID on the advice of her daughter, which stabilized the swelling but did not reduce swelling.   PT Comments   Patient and granddaughter seen together at bedside. Discussed plan for pt to discharge home today with hospice care at home. PT has recommended RW and 3n1 for home, both of which hospice is providing. Educated pt and granddaughter on general safety in the home to decr fall risk with RW use and pt being legally blind: no throw rugs or tape down edges, good lighting (especially if she has stairs) and to decr the amount of oxygen tubing on floors if possible to limit tripping hazards. Educated granddaughter also on use of 3N1 over toilet to elevated home toilet and provide handles to assist with pt getting up from toilet (she had concerns over it fitting next to pt's bed and unsure how it was used with toilet). Discussed use of back stairs at home that have two rails to support pt with one person behind pt and other in front of her to assist her up stairs. Pt declined to practice due to fatigue. Both without additional questions.    Follow Up Recommendations  Home health PT;Supervision/Assistance - 24 hour     Equipment Recommendations  Rolling walker with 5" wheels;Other (comment) (3n1)    Progress towards PT Goals Progress towards PT goals: Progressing toward goals  Plan Current plan remains appropriate    Precautions /  Restrictions Precautions Precautions: Fall Restrictions Weight Bearing Restrictions: No       Mobility  Bed Mobility Bed Mobility: Not assessed Transfers Transfers: Not assessed Ambulation/Gait Ambulation/Gait Assistance: Not tested (comment)      PT Goals (current goals can now be found in the care plan section) Acute Rehab PT Goals Patient Stated Goal: home PT Goal Formulation: With patient/family Time For Goal Achievement: 01/15/13 Potential to Achieve Goals: Good  Visit Information  Last PT Received On: 01/16/13 Assistance Needed: +1 History of Present Illness: 79 year-old lady with a past medical history of hypertension and peripheral vascular disease who presents today due to ankle swelling of two weeks' duration. Her peripheral vascular disease is managed by Dr. Rose Fillers; her last visit was 07/05/12. She edema is normally managed with 40 mg lasix BID. Ten days ago, she changed to 60 mg BID on the advice of her daughter, which stabilized the swelling but did not reduce swelling.    Subjective Data  Patient Stated Goal: home   Cognition  Cognition Arousal/Alertness: Awake/alert Behavior During Therapy: WFL for tasks assessed/performed Overall Cognitive Status: Within Functional Limits for tasks assessed       End of Session PT - End of Session Equipment Utilized During Treatment: Oxygen Activity Tolerance: Treatment limited secondary to medical complications (Comment) Patient left: in chair;with family/visitor present;with call bell/phone within reach Nurse Communication: Mobility status   GP     Sallyanne Kuster 01/16/2013, 11:49 AM  Sallyanne Kuster, PTA Office- [phone]

## 2013-01-16 NOTE — Progress Notes (Signed)
Pt is alert and oriented x4. Pt is legally blind. Pts grand-daughter is at the bedside. Pt has no complaints of pain at this time. Will continue to monitor

## 2013-01-16 NOTE — Progress Notes (Signed)
01/16/13 1000 Noted new CM referral for hospice of [facility].  Granddaughter, Rosey Bath, would like [employer] and the physician anticpates dc today.  TC to [facility] [phone]) to give referral.  Faxed facesheet, history and physical, and recent progress notes to [phone]).  Pt. will need DME oxygen, 3-N-1, and rolling walker.  DME will be provided by [facility], and they will deliver the oxygen tank to pt. room for dc. Tera Mater, RN, BSN, [place] [phone]

## 2013-01-16 NOTE — Progress Notes (Signed)
Palliative Medicine Team consult for hospice eligibility requested; spoke with Dr Debby Bud and patient and granddaughter Rosey Bath- they indicate goals are set; no symptom management needs at this time- per granddaughter patient/family  request [facility] referral; granddaughter indicated Dr Debby Bud would be by late afternoon and plan is to discharge by car once arrangements are in place; CMRN Ivonne Andrew notified and will follow up to contact [facility] and order necessary DME as per Dr Debby Bud- per granddaughter patient does not yet have O2 in the home and she plans to take patient home by personal vehicle and will need travel O2. Discussed briefly general hospice services offered per Medicare guidelines and made pt and granddaughter aware [facility] will also review services in detail- they voiced appreciation of information   -Gladys Damme requests Hospice and DME agency call her for arrangements- contact number [phone] As there are no further PMT needs will sign off at this time and [facility] will follow-up as noted above.       Thank you   Valente David, RN 01/16/2013, 11:03 AM Palliative Medicine Team RN Liaison [phone]

## 2013-01-16 NOTE — Progress Notes (Signed)
Subjective: [name] is lying in bed.  She denies any cough, chest pain or palpitations.  Her granddaughter states that she ([name]) is ready to go home and feels that medically, there is nothing this hospital can do for her at this point.   Objective:  Recent Labs  01/14/13 0505 01/15/13 0500 01/16/13 0500  WBC 5.9 5.9 6.8  HGB 8.7* 9.5* 10.2*  HCT 27.1* 29.9* 30.8*  MCV 91.9 92.6 90.1  PLT 287 300 283    Recent Labs  01/14/13 0505 01/15/13 0500 01/16/13 0500  NA 138 139 140  K 3.9 4.6 4.1  CL 95* 94* 93*  GLUCOSE 98 96 98  BUN 20 24* 28*  CREATININE 1.57* 1.72* 1.80*  CALCIUM 9.2 9.6 9.8    Imaging: CHEST x-ray - 2 VIEW IMPRESSION:  Mild CHF with small bibasilar effusions and minimal atelectasis.   Scheduled Meds: . acetaminophen  650 mg Oral BID  . amiodarone  200 mg Oral Daily  . aspirin  81 mg Oral Daily  . cilostazol  100 mg Oral BID  . diltiazem  240 mg Oral Daily  . feeding supplement  237 mL Oral TID BM  . ferrous sulfate  325 mg Oral Q breakfast  . furosemide  80 mg Oral BID  . metoprolol tartrate  25 mg Oral QID  . oxybutynin  5 mg Oral Daily  . potassium chloride  20 mEq Oral TID  . senna  1 tablet Oral BID  . sodium chloride  3 mL Intravenous Q12H   Continuous Infusions: . sodium chloride 10 mL/hr at 01/12/13 1901   PRN Meds:.bisacodyl   Physical Exam: Filed Vitals:   01/16/13 0651  BP: 125/75  Pulse: 86  Temp: 97.6 F (36.4 C)  Resp: 18    Intake/Output Summary (Last 24 hours) at 01/16/13 0981 Last data filed at 01/16/13 1914  Gross per 24 hour  Intake    841 ml  Output   1326 ml  Net   -485 ml   Total this adm: -4,200  Gen'l- Elderly lady lying in bed, able to sit with assistance. HEENT- blind Neck - no JVD in the seated position. Cor - 70-105 on tele, IRIR Pulm - Slightly diminished breath sounds at the bases with bibasalar rales Neuro - A&O x3 Extremities - Reduced edema in LE  Assessment/Plan: 1. A Fib persists  despite low-dose amiodarone.  Rate is better controlled - currently on diltiazem 240 and lopressor 25 qid. Plan Continue present regimen  2. CHF -  WOB has decreased; failed trial off nasal canaula  BNP (last 3 results)  Recent Labs  01/11/13 0645 01/12/13 0600 01/16/13 0500  PROBNP 1845.0* 2078.0* 2372.0*   Plan Reduce furosemide (Lasix) to 40 mg PO BID  3. HTN-  BP Readings from Last 3 Encounters:  01/16/13 125/75  01/07/13 134/60  07/05/12 155/54  Stable  4. Renal    Kidney function markedly decreased Most recent BUN: 28 Most recent creatinine: 1.80 Calculated est. GFR: 14.03 ml/min Plan Continue to monitor closely  Dispo - home with [facility] when ready for d/c. Palliative care consult called.  [name] 01/16/2013, 7:42 AM

## 2013-01-16 NOTE — Progress Notes (Signed)
Patient is clinically stable although with a poor prognosis and ready for D/C home with hospice for continued treatment and comfort care.   See d/c summary for final labs and exam  Dictated # [id number]

## 2013-01-17 NOTE — Discharge Summary (Signed)
[name], [name]                ACCOUNT NO.:  [id number]  MEDICAL RECORD NO.:  [id number]  LOCATION:  4E16C                        FACILITY:  [facility]  PHYSICIAN:  Rosalyn Gess. Norins, MD  DATE OF BIRTH:  03-12-1912  DATE OF ADMISSION:  01/07/2013 DATE OF DISCHARGE:  01/16/2013                              DISCHARGE SUMMARY   ADMITTING DIAGNOSES: 1. New-onset atrial fibrillation. 2. Acute peripheral edema. 3. Hypertension.  DISCHARGE DIAGNOSES: 1. Atrial fibrillation, persistent with rate control. 2. Peripheral edema, improved. 3. Hypertension, stable. 4. Congestive heart failure, diastolic, with persistent elevated BNP     and pulmonary edema.  CONSULTANTS:  [facility].  PROCEDURES/IMAGING: 1. Chest x-ray, date of admission with atelectasis, consolidation of     left lower lobe with superimposed small left pleural effusion.     Atherosclerosis is noted.  Pulmonary venous congestion without     frank pulmonary edema. 2. Chest x-ray, 2-view, January 09, 2013, with interval increase in     interstitial edema pattern.  Small pleural effusions.  Underlying     chronic bronchitic change and scarring. 3. CT angio on January 11, 2013, negative for acute pulmonary embolus.     Moderately large bilateral layering pleural effusions and     associated lower lobe atelectasis.  Mild pulmonary interstitial     edema secondary to volume overload or mild CHF.  Cardiomegaly with     biatrial enlargement.  Atherosclerosis including multi-vessel     coronary artery disease. 4. Chest x-ray on January 13, 2013, with small bilateral pleural     effusions.  Central vascular congestion is noted. 5. IVC filter placement in Radiology on January 14, 2013. 6. Chest x-ray on January 15, 2013, with mild CHF with small bibasilar     effusions and minimal atelectasis. 7. Echo.  The patient with echocardiogram performed on January 08, 2013, revealing mild LVH with  normal systolic function with     ejection fraction of 60-65%, which is a pseudonormal ejection     fraction.  Study was unable to assess for diastolic dysfunction or     increased pulmonary pressure.  Aortic valve with mild stenosis.     Mild regurgitation is noted.  Mitral valve with mild regurgitation,     a calcified anulus.  Left atrium was mildly dilated.  Right atrium     was moderately dilated.  Tricuspid valve with moderate     regurgitation.  Pulmonary arteries with PA peak pressure of 44     mmHg, which was mildly elevated, consistent with diastolic     dysfunction. 8. Noninvasive vascular on January 11, 2013, this revealed the patient     to have an old femoral DVT on the right.  Acute deep vein     thrombosis involving the right peroneal vein.  No evidence of Baker     cyst on the right.  HISTORY OF PRESENT ILLNESS:  [name] is a 79 year old woman with a past medical history for hypertension and peripheral vascular disease, who presented to the office due to significant increase in peripheral edema of 2 weeks duration.  She does  see Dr. Clifton James for cardiovascular disease and management with the last visit being July 05, 2012. Usually, her edema was managed with 40 mg of Lasix b.i.d.  Ten days prior to admission, Lasix was increased to 60 mg b.i.d. by the patient's granddaughter.  Because of persistent swelling and increasing shortness of breath, the patient presented to the office.  By granddaughter's report, the patient had had significant reduction in mobility.  The patient denied any shortness of breath, cough, chest pain, or racing heart.  Denied any numbness or tingling.  She has described increasing weakness for several years duration.  At the time of office evaluation and exam, the patient was found to be in atrial fibrillation with rapid ventricular response of approximately 120-130 beats per minute and she was subsequently admitted to hospital for inpatient  care.  Please see the H and P for past medical history, family history, social history, and admission physical exam.  HOSPITAL COURSE: 1. Atrial fibrillation.  The patient remained in atrial fibrillation     by telemetry.  She was started on diltiazem, eventually advanced to     240 mg daily and Lopressor 25 mg q.i.d. for rate control, which was     successful in reducing her rate to the 90s to 100 range.  The patient     was started on low-dose amiodarone with the hopes of chemical     cardioversion; however, she remained in atrial fibrillation without     change.  The patient was seen in consultation by the Cardiology     Service, who had no additional changes to make to her medical     regimen.  At the time of discharge, the patient is continuing to be in atrial fibrillation.  We will continue diltiazem and Lopressor for rate control.  We will discontinue amiodarone as being ineffective.  2. Congestive heart failure.  The patient had congestive heart failure     during this admission.  She was treated with high-dose Lasix     advancing to 80 mg of Lasix IV b.i.d. And Zaroxolyn 2.5 mg at q.a.m.     On this regime the patient did have significant diuresis,     although slow to be initiated.  So at time of discharge, she had     diuresed a total of 4 liters of fluid.  The patient's BNP did     remain elevated during her hospital stay.  Initial BNP of     2131 reaching a low point of 1751 on January 09, 2013, but on the     day of discharge, BNP was 2372.  Chest x-rays did show some     improvement in pulmonary edema and congestion.  The patient's     increased work of breathing was improved at the time of discharge     dictation, but she still required oxygen, having had O2 saturation     of 82-84% on room air.  At this point, oxygen is also considered     for palliative management for comfort to avoid significant     shortness of breath.  Cardiology followed with this patient and  had     no additional changes to make in regards to her medical management     and agreed that her prognosis was poor.  Because of progressive     renal insufficiency, the patient's Lasix doses have been reduced,     but we will monitor this closely with the option to  increase Lasix     if she becomes uncomfortable with progressive and increasing     shortness of breath.  3. Hypertension.  The patient's blood pressure has been stable during     her hospital admission.  Despite the addition of multiple     medications, she has not been hypotensive.  We will continue     medications as below.  4. Renal insufficiency.  The patient had progressive and acute renal     insufficiency due to volume depletion.  Her creatinine at time of     admission was 1.44.  Creatinine at time of discharge was 1.80 with     an estimated glomerular filtration tracing rate of life 22     mL/minute.  The patient has not had any evidence of significant     symptoms related to renal failure.  Family is aware the fact that     we are on tight line between managing her fluid overload and renal     insufficiency.  At this point, the patient's family and the patient prefer to be at home.  They understand at age 49 with her existing problems, her prognosis is very poor. The plan is for to be discharged home with [facility] to assist in management care.  We will continue to treat her congestive heart failure in a nonaggressive fashion.  Dr. Debby Bud will remain the attending of record and will work in consultation with the [facility] staff.  The patient and her family accept this plan and prefer it.  Arrangements have been made for durable medical equipment to including bedside commode, 5-inch wheeled rolling walker and home oxygen for comfort care and to maintain adequate oxygen saturation.  DISCHARGE EXAMINATION:  VITAL SIGNS:  Temperature was 97.6, blood pressure 142/51, heart rate was 82,  respirations 18, oxygen saturation 100% on 2 liters of nasal cannula. GENERAL APPEARANCE:  This is a very elderly woman, pleasant, very poor eyesight, but good hearing, who is in no acute distress. HEENT:  No oral lesions were noted. NECK:  Supple. CHEST:  No deformities are noted. PULMONARY:  The patient has minimal increased work of breathing with some use of abdominal musculature, but no severe increased work of breathing.  She is not actively short of breath.  No wheezing was appreciated.  The patient was noted to have faint bibasilar rales. CARDIOVASCULAR:  2+ radial pulse.  Her precordium was quiet.  Her heart sounds were distant.  She had an irregularly irregular rate. BREASTS:  Deferred. ABDOMEN:  Scaphoid.  She had positive bowel sounds. GENITALIA:  Deferred. RECTAL:  Deferred. EXTREMITIES:  No clubbing was noted.  No deformities were noted.  The patient has 1+ pedal edema. NEUROLOGIC:  The patient is awake, she is alert.  Cognition seems normal with clear speech and understanding of her situation.  She does move all of her extremities to exam.  She has been able to get up with assistance.  FINAL LABORATORY:  From the day of discharge, sodium was 140, potassium 4.1, chloride 93, CO2 36, BUN 28, creatinine was 1.8, glucose was 98. BNP was 2372.  LDH isoenzymes were drawn on January 07, 2013, and returned as unremarkable with no evidence of an acute cardiac event. The patient did have iron panel, which revealed her to have slightly depleted iron at 24.  Iron saturation was very low at 8%.  TIBC was 312. Ferritin was normal at 22.  Hemoglobin at time of discharge was down to  8.2 g.  The patient's hemoglobin in November of 2013, was 14.5.  The patient has no sign of active bleeding.  Explanation of her anemia is most likely anemia of chronic disease and progressive renal failure. The patient is not for aggressive therapy or treatment, we will not infuse iron, we will not  transfuse.  DISCHARGE MEDICATIONS: 1. Amlodipine 10 mg p.o. daily. 2. Aspirin 81 mg daily. 3. Calcium with vitamin D daily. 4. Pletal 100 mg b.i.d. 5. Diltiazem CD 240 mg once daily. 6. Feeding supplement with Ensure 2 times daily. 7. Lasix 40 mg b.i.d. 8. Lopressor 50 mg p.o. b.i.d. 9. Oxybutynin 5 mg once daily for overactive bladder. 10.Systane eye drops as needed. 11.Potassium 20 mEq daily. 12.Senokot 1 tablet b.i.d.  DISPOSITION:  The patient was discharged to home with hospice.  The plan is to manage her for comfort care including managing fluid overload and congestive heart failure with that goal in mind.  She is not a candidate for return to hospital.  She is not a candidate for additional aggressive therapy.  The patient will be followed by [facility].  She may be candidate for residential hospice care if it becomes difficult to manage her at home.  The patient's condition at time of discharge dictation is medically stable with a terminal prognosis given her advanced age and current medical problems.     Rosalyn Gess Norins, MD     MEN/MEDQ  D:  01/16/2013  T:  01/17/2013  Job:  [id number]  cc:   [facility] Verne Carrow, [place]

## 2013-01-22 ENCOUNTER — Encounter: Payer: Self-pay | Admitting: Internal Medicine

## 2013-01-22 DIAGNOSIS — I5032 Chronic diastolic (congestive) heart failure: Secondary | ICD-10-CM

## 2013-01-22 DIAGNOSIS — I4891 Unspecified atrial fibrillation: Secondary | ICD-10-CM

## 2013-01-22 DIAGNOSIS — Z515 Encounter for palliative care: Secondary | ICD-10-CM

## 2013-01-22 DIAGNOSIS — I10 Essential (primary) hypertension: Secondary | ICD-10-CM

## 2013-01-22 DIAGNOSIS — R609 Edema, unspecified: Secondary | ICD-10-CM

## 2013-01-27 ENCOUNTER — Encounter: Payer: Self-pay | Admitting: Internal Medicine

## 2013-01-27 DIAGNOSIS — I4891 Unspecified atrial fibrillation: Secondary | ICD-10-CM | POA: Insufficient documentation

## 2013-01-27 DIAGNOSIS — I5032 Chronic diastolic (congestive) heart failure: Secondary | ICD-10-CM | POA: Insufficient documentation

## 2013-01-27 NOTE — Progress Notes (Signed)
Subjective:    Patient ID: [name], female    DOB: Jun 01, 1911, 79 y.o.   MRN: [id number]  HPI Home visit made to [name]. She lives [place] outside of [place], [address], in a small house that she shares with her son. The home is well kept and neat. [name] was in bed in a back bedroom.  [name] is 79 y/o and was discharged from the hospital after being admitted with new on-set atrial fibrillation and diastolic heart failure. She did not convert medical despite trial of amiodarone, which was stopped at discharge. She was rate controlled with CCB. She did have a moderate diuresis of about 1600 cc. In hopsital she was hypoxemic on room air and was to continue oxygen at home. Given her advanced age and new heart failure she is a [facility].  Since discharge she has done OK. She is weak and spends much of her time in bed or chair. She is able to ambulate in the home and has been able to get to the dining room for meals. Her extended family is very supportive She is happy to be at home. She has no complaints and family confirms that she has been doing well.  Past Medical History  Diagnosis Date  . Peripheral vascular disease   . Peripheral edema   . COPD (chronic obstructive pulmonary disease)   . Hypertension   . Macular degeneration     bilateral  . Irritable bladder   . Rectal fissure   . Enteritis   . Duodenitis   . Esophagitis   . DJD (degenerative joint disease)   . Dyspepsia   . GERD (gastroesophageal reflux disease)    Past Surgical History  Procedure Laterality Date  . Mole excision    . Cataract surg    . Laparotomy for treatment of adhession    . Appendectomy     No family history on file. History   Social History  . Marital Status: Widowed    Spouse Name: N/A    Number of Children: N/A  . Years of Education: N/A   Occupational History  . Not on file.   Social History Main Topics  . Smoking status: Never Smoker    . Smokeless tobacco: Never Used  . Alcohol Use: No  . Drug Use: No  . Sexual Activity: No   Other Topics Concern  . Not on file   Social History Narrative   Lives with her son. I-ADLs. Widow x 43 years. She drinks daily coffee. ACP-PT is DNR/DNI; reviewed and completed MOST form: no intensive care, antibiotics after evaluation, short-term IVF, no tube feeding. Grand-daughter/POA present. (Oct '12)    Current Outpatient Prescriptions on File Prior to Visit  Medication Sig Dispense Refill  . aspirin 81 MG tablet Take 81 mg by mouth daily.        . Calcium Carbonate-Vitamin D (CALTRATE 600+D PO) Take 1 tablet by mouth 2 (two) times daily.      . cilostazol (PLETAL) 100 MG tablet Take 100 mg by mouth 2 (two) times daily.      Marland Kitchen diltiazem (CARDIZEM CD) 240 MG 24 hr capsule Take 1 capsule (240 mg total) by mouth daily.  30 capsule  11  . feeding supplement (ENSURE COMPLETE) LIQD Take 237 mLs by mouth 3 (three) times daily between meals.      . furosemide (LASIX) 40 MG tablet Take 1 tablet (40 mg total) by mouth 2 (two)  times daily.  180 tablet  1  . metoprolol tartrate (LOPRESSOR) 50 MG tablet Take 1 tablet (50 mg total) by mouth 2 (two) times daily.  60 tablet  11  . oxybutynin (DITROPAN) 5 MG tablet Take 5 mg by mouth daily.      Bertram Gala Glycol-Propyl Glycol (SYSTANE OP) Place 1 drop into both eyes every morning.      . potassium chloride SA (K-DUR,KLOR-CON) 20 MEQ tablet Take 1 tablet (20 mEq total) by mouth daily.  30 tablet  11  . senna (SENOKOT) 8.6 MG TABS tablet Take 1 tablet (8.6 mg total) by mouth 2 (two) times daily.  120 each  0  . [DISCONTINUED] oxybutynin (DITROPAN) 5 MG tablet Take 1 tablet (5 mg total) by mouth daily.  90 tablet  1   No current facility-administered medications on file prior to visit.      Review of Systems No chest pain, no marked SOB, slow bowel function but no abdominal pain, awake much of the time and alert, no change in poor vision.     Objective:   Physical Exam Examined in her bed - under many quilts BP 100/60, warm but not hot to the touch, feet feel cold Cor - 2+ radial, IRIR, auscultation w/o murmur Pulm - wearing oxygen. No increased WOB, no rales Abd - soft, BS + Neuro - awake, blind, alert       Assessment & Plan:

## 2013-01-27 NOTE — Assessment & Plan Note (Signed)
Hospice patient with goal of home care and not return to hospital if possible - returning for comfort care as needed.

## 2013-01-27 NOTE — Assessment & Plan Note (Signed)
BP Readings from Last 3 Encounters:  01/27/13 100/60  01/16/13 142/51  01/07/13 134/60   BP is a little soft today.   Plan Continue present medications for BP, rate control  RN follow up with adjustments if BP drops - can reduce or stop BB

## 2013-01-27 NOTE — Assessment & Plan Note (Signed)
Patient with pseudo normal EF with high ventricular filling pressures. She did diurese while in hospital. At today's exam she does not appear to have decompensated - no respiratory distress.  PLan Continue present medications.

## 2013-01-27 NOTE — Assessment & Plan Note (Signed)
Very little peripheral edema at today's exam.

## 2013-01-27 NOTE — Assessment & Plan Note (Signed)
New onset August '14. Persistent but rate controlled with CCB. Hemodynamically stable.  Plan Continue medical therapy for rate control

## 2013-01-29 ENCOUNTER — Telehealth: Payer: Self-pay

## 2013-01-29 NOTE — Telephone Encounter (Signed)
Documentation from 01/22/13 home visit has been faxed to [facility] [place] at [phone].

## 2013-03-19 ENCOUNTER — Other Ambulatory Visit: Payer: Self-pay | Admitting: Internal Medicine

## 2013-04-01 ENCOUNTER — Telehealth: Payer: Self-pay

## 2013-04-01 NOTE — Telephone Encounter (Signed)
Heidi Dach unavailable. Left message for him to return call.

## 2013-04-01 NOTE — Telephone Encounter (Signed)
Phone call from Heidi Dach (nurse at [facility] requesting an order to collect urine sample due to patient's confusion which is not normal for her. Please advise.

## 2013-04-01 NOTE — Telephone Encounter (Signed)
Ok results to me

## 2013-04-01 NOTE — Telephone Encounter (Signed)
[name] called back and has been notified that it's okay to collect a urine sample.

## 2013-04-05 ENCOUNTER — Telehealth: Payer: Self-pay

## 2013-04-05 NOTE — Telephone Encounter (Signed)
Phone call to [facility]. Left a message for Rick(nurse) to return call. Need to make sure patient is being treated for her UTI with Cipro 250 mg  BID x 5 days.

## 2013-04-05 NOTE — Telephone Encounter (Signed)
[name] has been notified that patient needs to be taking Cipro (see message below)

## 2013-04-18 ENCOUNTER — Encounter: Payer: Self-pay | Admitting: Internal Medicine

## 2013-04-22 ENCOUNTER — Encounter: Payer: Self-pay | Admitting: Internal Medicine

## 2013-04-29 ENCOUNTER — Telehealth: Payer: Self-pay | Admitting: *Deleted

## 2013-04-29 MED ORDER — CIPROFLOXACIN HCL 250 MG PO TABS: 250.0000 mg | ORAL_TABLET | Freq: Two times a day (BID) | ORAL | Status: DC

## 2013-04-29 NOTE — Telephone Encounter (Signed)
Ok for Cipro 250 mg bid x 5 days.

## 2013-04-29 NOTE — Telephone Encounter (Signed)
[name] called states pt was prescribed Cipro for UTI; while on Cipro confusion also cleared up.  States over the weekend pts confusion returned; he is requesting a refill on Cipro.  Please advise

## 2013-04-29 NOTE — Telephone Encounter (Signed)
Rx sent, unable to contact [place], phone line busy.

## 2013-05-03 ENCOUNTER — Telehealth: Payer: Self-pay

## 2013-05-03 NOTE — Telephone Encounter (Signed)
Phone call from Heidi Dach [phone] with [facility] states patient is beginning to be confused again and wants to know if taking another round of cipro may help again. Pharmacy is [facility].

## 2013-05-03 NOTE — Telephone Encounter (Signed)
Left a detailed message for [name] regarding MD order.

## 2013-05-03 NOTE — Telephone Encounter (Signed)
Ok - cipro 250 mg bid x 7 days. If possible a U/A prior to starting antibiotics

## 2013-06-01 ENCOUNTER — Encounter (HOSPITAL_COMMUNITY): Payer: Self-pay | Admitting: Emergency Medicine

## 2013-06-01 ENCOUNTER — Emergency Department (HOSPITAL_COMMUNITY): Payer: Medicare Other

## 2013-06-01 ENCOUNTER — Inpatient Hospital Stay (HOSPITAL_COMMUNITY)
Admission: EM | Admit: 2013-06-01 | Discharge: 2013-06-03 | DRG: 536 | Disposition: A | Discharge status: Hospice | Payer: Medicare Other | Attending: Internal Medicine | Admitting: Internal Medicine

## 2013-06-01 DIAGNOSIS — W19XXXA Unspecified fall, initial encounter: Secondary | ICD-10-CM | POA: Diagnosis present

## 2013-06-01 DIAGNOSIS — I10 Essential (primary) hypertension: Secondary | ICD-10-CM

## 2013-06-01 DIAGNOSIS — Z515 Encounter for palliative care: Secondary | ICD-10-CM

## 2013-06-01 DIAGNOSIS — N3289 Other specified disorders of bladder: Secondary | ICD-10-CM

## 2013-06-01 DIAGNOSIS — I4891 Unspecified atrial fibrillation: Secondary | ICD-10-CM | POA: Diagnosis present

## 2013-06-01 DIAGNOSIS — H3530 Unspecified macular degeneration: Secondary | ICD-10-CM

## 2013-06-01 DIAGNOSIS — J4489 Other specified chronic obstructive pulmonary disease: Secondary | ICD-10-CM

## 2013-06-01 DIAGNOSIS — Z66 Do not resuscitate: Secondary | ICD-10-CM | POA: Diagnosis present

## 2013-06-01 DIAGNOSIS — K219 Gastro-esophageal reflux disease without esophagitis: Secondary | ICD-10-CM

## 2013-06-01 DIAGNOSIS — I509 Heart failure, unspecified: Secondary | ICD-10-CM | POA: Diagnosis present

## 2013-06-01 DIAGNOSIS — Z8719 Personal history of other diseases of the digestive system: Secondary | ICD-10-CM

## 2013-06-01 DIAGNOSIS — S42213A Unspecified displaced fracture of surgical neck of unspecified humerus, initial encounter for closed fracture: Secondary | ICD-10-CM | POA: Diagnosis present

## 2013-06-01 DIAGNOSIS — J449 Chronic obstructive pulmonary disease, unspecified: Secondary | ICD-10-CM | POA: Diagnosis present

## 2013-06-01 DIAGNOSIS — R609 Edema, unspecified: Secondary | ICD-10-CM

## 2013-06-01 DIAGNOSIS — R1013 Epigastric pain: Secondary | ICD-10-CM

## 2013-06-01 DIAGNOSIS — Z9181 History of falling: Secondary | ICD-10-CM

## 2013-06-01 DIAGNOSIS — Z7982 Long term (current) use of aspirin: Secondary | ICD-10-CM

## 2013-06-01 DIAGNOSIS — S72009A Fracture of unspecified part of neck of unspecified femur, initial encounter for closed fracture: Secondary | ICD-10-CM

## 2013-06-01 DIAGNOSIS — I739 Peripheral vascular disease, unspecified: Secondary | ICD-10-CM

## 2013-06-01 DIAGNOSIS — I5032 Chronic diastolic (congestive) heart failure: Secondary | ICD-10-CM | POA: Diagnosis present

## 2013-06-01 DIAGNOSIS — Y92009 Unspecified place in unspecified non-institutional (private) residence as the place of occurrence of the external cause: Secondary | ICD-10-CM

## 2013-06-01 DIAGNOSIS — S42302A Unspecified fracture of shaft of humerus, left arm, initial encounter for closed fracture: Secondary | ICD-10-CM

## 2013-06-01 DIAGNOSIS — M1990 Unspecified osteoarthritis, unspecified site: Secondary | ICD-10-CM

## 2013-06-01 DIAGNOSIS — K3189 Other diseases of stomach and duodenum: Secondary | ICD-10-CM

## 2013-06-01 DIAGNOSIS — S72143A Displaced intertrochanteric fracture of unspecified femur, initial encounter for closed fracture: Principal | ICD-10-CM | POA: Diagnosis present

## 2013-06-01 DIAGNOSIS — S72002A Fracture of unspecified part of neck of left femur, initial encounter for closed fracture: Secondary | ICD-10-CM

## 2013-06-01 LAB — URINALYSIS, ROUTINE W REFLEX MICROSCOPIC
Bilirubin Urine: NEGATIVE
Glucose, UA: NEGATIVE mg/dL
Hgb urine dipstick: NEGATIVE
Ketones, ur: NEGATIVE mg/dL
Leukocytes, UA: NEGATIVE
Nitrite: NEGATIVE
Protein, ur: NEGATIVE mg/dL
Specific Gravity, Urine: 1.015 (ref 1.005–1.030)
Urobilinogen, UA: 0.2 mg/dL (ref 0.0–1.0)
pH: 5.5 (ref 5.0–8.0)

## 2013-06-01 LAB — CBC WITH DIFFERENTIAL/PLATELET
Basophils Absolute: 0 10*3/uL (ref 0.0–0.1)
Basophils Relative: 0 % (ref 0–1)
Eosinophils Absolute: 0 10*3/uL (ref 0.0–0.7)
Eosinophils Relative: 0 % (ref 0–5)
HCT: 32.2 % — ABNORMAL LOW (ref 36.0–46.0)
Hemoglobin: 10.6 g/dL — ABNORMAL LOW (ref 12.0–15.0)
Lymphocytes Relative: 20 % (ref 12–46)
Lymphs Abs: 1.7 10*3/uL (ref 0.7–4.0)
MCH: 28.7 pg (ref 26.0–34.0)
MCHC: 32.9 g/dL (ref 30.0–36.0)
MCV: 87.3 fL (ref 78.0–100.0)
Monocytes Absolute: 0.9 10*3/uL (ref 0.1–1.0)
Monocytes Relative: 11 % (ref 3–12)
Neutro Abs: 5.5 10*3/uL (ref 1.7–7.7)
Neutrophils Relative %: 68 % (ref 43–77)
Platelets: 189 10*3/uL (ref 150–400)
RBC: 3.69 MIL/uL — ABNORMAL LOW (ref 3.87–5.11)
RDW: 16.4 % — ABNORMAL HIGH (ref 11.5–15.5)
WBC: 8.1 10*3/uL (ref 4.0–10.5)

## 2013-06-01 LAB — BASIC METABOLIC PANEL
BUN: 25 mg/dL — ABNORMAL HIGH (ref 6–23)
CO2: 29 mEq/L (ref 19–32)
Calcium: 9 mg/dL (ref 8.4–10.5)
Chloride: 100 mEq/L (ref 96–112)
Creatinine, Ser: 1.41 mg/dL — ABNORMAL HIGH (ref 0.50–1.10)
GFR calc Af Amer: 34 mL/min — ABNORMAL LOW (ref >90)
GFR calc non Af Amer: 30 mL/min — ABNORMAL LOW (ref >90)
Glucose, Bld: 123 mg/dL — ABNORMAL HIGH (ref 70–99)
Potassium: 4.4 mEq/L (ref 3.7–5.3)
Sodium: 141 mEq/L (ref 137–147)

## 2013-06-01 MED ORDER — OXYBUTYNIN CHLORIDE 5 MG PO TABS
5.0000 mg | ORAL_TABLET | Freq: Every day | ORAL | Status: DC
  Administered 2013-06-01 – 2013-06-03 (×3): 5 mg via ORAL
  Filled 2013-06-01 (×3): qty 1

## 2013-06-01 MED ORDER — DILTIAZEM HCL ER COATED BEADS 240 MG PO CP24
240.0000 mg | ORAL_CAPSULE | Freq: Every day | ORAL | Status: DC
  Administered 2013-06-01 – 2013-06-03 (×3): 240 mg via ORAL
  Filled 2013-06-01 (×3): qty 1

## 2013-06-01 MED ORDER — FENTANYL CITRATE 0.05 MG/ML IJ SOLN: 50.0000 ug | INTRAMUSCULAR | Status: DC | PRN

## 2013-06-01 MED ORDER — ENOXAPARIN SODIUM 30 MG/0.3ML ~~LOC~~ SOLN
30.0000 mg | SUBCUTANEOUS | Status: DC
  Administered 2013-06-01 – 2013-06-02 (×2): 30 mg via SUBCUTANEOUS
  Filled 2013-06-01 (×3): qty 0.3

## 2013-06-01 MED ORDER — SODIUM CHLORIDE 0.9 % IV SOLN: 250.0000 mL | INTRAVENOUS | Status: DC | PRN

## 2013-06-01 MED ORDER — SENNA 8.6 MG PO TABS
1.0000 | ORAL_TABLET | Freq: Two times a day (BID) | ORAL | Status: DC
  Administered 2013-06-01 – 2013-06-03 (×4): 8.6 mg via ORAL
  Filled 2013-06-01 (×5): qty 1

## 2013-06-01 MED ORDER — SODIUM CHLORIDE 0.9 % IJ SOLN
3.0000 mL | Freq: Two times a day (BID) | INTRAMUSCULAR | Status: DC
  Administered 2013-06-01 – 2013-06-02 (×2): 3 mL via INTRAVENOUS

## 2013-06-01 MED ORDER — METOPROLOL TARTRATE 50 MG PO TABS
50.0000 mg | ORAL_TABLET | Freq: Two times a day (BID) | ORAL | Status: DC
  Administered 2013-06-01 – 2013-06-03 (×4): 50 mg via ORAL
  Filled 2013-06-01 (×5): qty 1

## 2013-06-01 MED ORDER — ACETAMINOPHEN 325 MG PO TABS
650.0000 mg | ORAL_TABLET | Freq: Three times a day (TID) | ORAL | Status: DC | PRN
  Administered 2013-06-01 – 2013-06-03 (×3): 650 mg via ORAL
  Filled 2013-06-01 (×4): qty 2

## 2013-06-01 MED ORDER — ONDANSETRON HCL 4 MG PO TABS: 4.0000 mg | ORAL_TABLET | Freq: Four times a day (QID) | ORAL | Status: DC | PRN

## 2013-06-01 MED ORDER — ONDANSETRON HCL 4 MG/2ML IJ SOLN
4.0000 mg | Freq: Four times a day (QID) | INTRAMUSCULAR | Status: DC | PRN
Start: 2013-06-01 — End: 2013-06-03

## 2013-06-01 MED ORDER — ACETAMINOPHEN ER 650 MG PO TBCR: 650.0000 mg | EXTENDED_RELEASE_TABLET | Freq: Three times a day (TID) | ORAL | Status: DC | PRN

## 2013-06-01 MED ORDER — FENTANYL CITRATE 0.05 MG/ML IJ SOLN: 50.0000 ug | Freq: Once | INTRAMUSCULAR | Status: DC

## 2013-06-01 MED ORDER — SODIUM CHLORIDE 0.9 % IJ SOLN
3.0000 mL | INTRAMUSCULAR | Status: DC | PRN
Start: 2013-06-01 — End: 2013-06-03

## 2013-06-01 MED ORDER — FENTANYL CITRATE 0.05 MG/ML IJ SOLN: 12.5000 ug | INTRAMUSCULAR | Status: DC | PRN

## 2013-06-01 NOTE — H&P (Signed)
PCP:   Adella Hare, MD   Chief Complaint:  Fall  HPI: 80 year old female who   has a past medical history of Peripheral vascular disease; Peripheral edema; COPD (chronic obstructive pulmonary disease); Hypertension; Macular degeneration; Irritable bladder; Rectal fissure; Enteritis; Duodenitis; Esophagitis; DJD (degenerative joint disease); Dyspepsia; and GERD (gastroesophageal reflux disease). Was brought to the ED after patient fell at home. As per patient, who can provide very good history she has been falling over the past 2 days and after today's fall she could not get up. When her son arrived home he tried to lift her hand patient could not stand. So he called EMS, and patient was brought to the hospital and was found to have left hip and left humerus fracture. Patient is currently is under hospice care. She denies pain, no chest pain no shortness of breath no nausea vomiting or diarrhea. She denies passing out. As per patient's son, she has been having hallucinations at home. Allergies:   Allergies  Allergen Reactions  . Codone [Hydrocodone]     hallucinations  . Codeine Other (See Comments)    Hallucinations/abnormal behavior Per family member pt can not tolerate strong pain medications      Past Medical History  Diagnosis Date  . Peripheral vascular disease   . Peripheral edema   . COPD (chronic obstructive pulmonary disease)   . Hypertension   . Macular degeneration     bilateral  . Irritable bladder   . Rectal fissure   . Enteritis   . Duodenitis   . Esophagitis   . DJD (degenerative joint disease)   . Dyspepsia   . GERD (gastroesophageal reflux disease)     Past Surgical History  Procedure Laterality Date  . Mole excision    . Cataract surg    . Laparotomy for treatment of adhession    . Appendectomy      Prior to Admission medications   Medication Sig Start Date End Date Taking? Authorizing Provider  acetaminophen (TYLENOL) 650 MG CR tablet Take 650  mg by mouth every 8 (eight) hours as needed for pain.   Yes Historical Provider, MD  aspirin 81 MG tablet Take 81 mg by mouth daily.     Yes Historical Provider, MD  Calcium Carbonate-Vitamin D (CALTRATE 600+D PO) Take 1 tablet by mouth 2 (two) times daily.   Yes Historical Provider, MD  cilostazol (PLETAL) 100 MG tablet TAKE ONE TABLET BY MOUTH TWICE DAILY 03/19/13  Yes Neena Rhymes, MD  diltiazem (CARDIZEM CD) 240 MG 24 hr capsule Take 1 capsule (240 mg total) by mouth daily. 01/16/13  Yes Neena Rhymes, MD  furosemide (LASIX) 40 MG tablet Take 1 tablet (40 mg total) by mouth 2 (two) times daily. 12/21/11 06/01/13 Yes Neena Rhymes, MD  metoprolol tartrate (LOPRESSOR) 50 MG tablet Take 1 tablet (50 mg total) by mouth 2 (two) times daily. 01/16/13  Yes Neena Rhymes, MD  oxybutynin (DITROPAN) 5 MG tablet Take 5 mg by mouth daily.   Yes Historical Provider, MD  Polyethyl Glycol-Propyl Glycol (SYSTANE OP) Place 1 drop into both eyes every morning.   Yes Historical Provider, MD  potassium chloride SA (K-DUR,KLOR-CON) 20 MEQ tablet Take 1 tablet (20 mEq total) by mouth daily. 01/16/13  Yes Neena Rhymes, MD  senna (SENOKOT) 8.6 MG TABS tablet Take 1 tablet (8.6 mg total) by mouth 2 (two) times daily. 01/16/13  Yes Neena Rhymes, MD    Social History:  reports that  she has never smoked. She has never used smokeless tobacco. She reports that she does not drink alcohol or use illicit drugs.  No family history on file.   All the positives are listed in BOLD  Review of Systems:  HEENT: Headache, blurred vision, runny nose, sore throat Neck: Hypothyroidism, hyperthyroidism,,lymphadenopathy Chest : Shortness of breath, history of COPD, Asthma Heart : Chest pain, history of coronary arterey disease, he GI:  Nausea, vomiting, diarrhea, constipation, GERD GU: Dysuria, urgency, frequency of urination, hematuria Neuro: Stroke, seizures, syncope Psych: Depression, anxiety,  hallucinations   Physical Exam: Blood pressure 141/79, pulse 122, temperature 98.8 F (37.1 C), temperature source Oral, resp. rate 18, SpO2 95.00%. Constitutional:   Patient is a well-developed and well-nourished female* in no acute distress and cooperative with exam. Head: Normocephalic and atraumatic Mouth: Mucus membranes moist Neck: Supple, No Thyromegaly Cardiovascular: RRR, S1 normal, S2 normal Pulmonary/Chest: CTAB, no wheezes, rales, or rhonchi Abdominal: Soft. Non-tender, non-distended, bowel sounds are normal, no masses, organomegaly, or guarding present.  Neurological: A&O x3, Strenght is normal and symmetric bilaterally, cranial nerve II-XII are grossly intact, no focal motor deficit, sensory intact to light touch bilaterally.  Extremities : No Cyanosis, Clubbing or Edema   Labs on Admission:  Results for orders placed during the hospital encounter of 06/01/13 (from the past 48 hour(s))  CBC WITH DIFFERENTIAL     Status: Abnormal   Collection Time    06/01/13  3:03 PM      Result Value Range   WBC 8.1  4.0 - 10.5 K/uL   RBC 3.69 (*) 3.87 - 5.11 MIL/uL   Hemoglobin 10.6 (*) 12.0 - 15.0 g/dL   HCT 32.2 (*) 36.0 - 46.0 %   MCV 87.3  78.0 - 100.0 fL   MCH 28.7  26.0 - 34.0 pg   MCHC 32.9  30.0 - 36.0 g/dL   RDW 16.4 (*) 11.5 - 15.5 %   Platelets 189  150 - 400 K/uL   Neutrophils Relative % 68  43 - 77 %   Neutro Abs 5.5  1.7 - 7.7 K/uL   Lymphocytes Relative 20  12 - 46 %   Lymphs Abs 1.7  0.7 - 4.0 K/uL   Monocytes Relative 11  3 - 12 %   Monocytes Absolute 0.9  0.1 - 1.0 K/uL   Eosinophils Relative 0  0 - 5 %   Eosinophils Absolute 0.0  0.0 - 0.7 K/uL   Basophils Relative 0  0 - 1 %   Basophils Absolute 0.0  0.0 - 0.1 K/uL  BASIC METABOLIC PANEL     Status: Abnormal   Collection Time    06/01/13  3:03 PM      Result Value Range   Sodium 141  137 - 147 mEq/L   Comment: Please note change in reference range.   Potassium 4.4  3.7 - 5.3 mEq/L   Comment: Please  note change in reference range.   Chloride 100  96 - 112 mEq/L   CO2 29  19 - 32 mEq/L   Glucose, Bld 123 (*) 70 - 99 mg/dL   BUN 25 (*) 6 - 23 mg/dL   Creatinine, Ser 1.41 (*) 0.50 - 1.10 mg/dL   Calcium 9.0  8.4 - 10.5 mg/dL   GFR calc non Af Amer 30 (*) >90 mL/min   GFR calc Af Amer 34 (*) >90 mL/min   Comment: (NOTE)     The eGFR has been calculated using the CKD EPI  equation.     This calculation has not been validated in all clinical situations.     eGFR's persistently <90 mL/min signify possible Chronic Kidney     Disease.  URINALYSIS, ROUTINE W REFLEX MICROSCOPIC     Status: None   Collection Time    06/01/13  3:04 PM      Result Value Range   Color, Urine YELLOW  YELLOW   APPearance CLEAR  CLEAR   Specific Gravity, Urine 1.015  1.005 - 1.030   pH 5.5  5.0 - 8.0   Glucose, UA NEGATIVE  NEGATIVE mg/dL   Hgb urine dipstick NEGATIVE  NEGATIVE   Bilirubin Urine NEGATIVE  NEGATIVE   Ketones, ur NEGATIVE  NEGATIVE mg/dL   Protein, ur NEGATIVE  NEGATIVE mg/dL   Urobilinogen, UA 0.2  0.0 - 1.0 mg/dL   Nitrite NEGATIVE  NEGATIVE   Leukocytes, UA NEGATIVE  NEGATIVE   Comment: MICROSCOPIC NOT DONE ON URINES WITH NEGATIVE PROTEIN, BLOOD, LEUKOCYTES, NITRITE, OR GLUCOSE <1000 mg/dL.    Radiological Exams on Admission: Dg Chest 1 View  06/01/2013   CLINICAL DATA:  SHOULDER INJURY HIP INJURY FALL  EXAM: CHEST - 1 VIEW  COMPARISON:  DG CHEST 2 VIEW dated 01/15/2013; DG CHEST 2 VIEW dated 01/09/2013; DG CHEST 2 VIEW dated 01/13/2013; CT ANGIO CHEST W/CM &/OR WO/CM dated 01/11/2013  FINDINGS: There is no focal parenchymal opacity, pleural effusion, or pneumothorax. There is stable cardiomegaly.  The osseous structures are unremarkable.  IMPRESSION: No active disease.   Electronically Signed   By: Kathreen Devoid   On: 06/01/2013 14:10   Dg Hip Complete Left  06/01/2013   CLINICAL DATA:  SHOULDER INJURY HIP INJURY FALL  EXAM: LEFT HIP - COMPLETE 2+ VIEW  COMPARISON:  No comparisons  FINDINGS: There  is a comminuted left intertrochanteric fracture with mild displacement and angulation. There is no left hip dislocation.  There is no right hip fracture or dislocation. There are mild degenerative changes of the right hip.  There is generalized osteopenia.  IMPRESSION: Comminuted and displaced left intertrochanteric fracture.   Electronically Signed   By: Kathreen Devoid   On: 06/01/2013 14:08   Dg Shoulder Left  06/01/2013   CLINICAL DATA:  SHOULDER INJURY HIP INJURY FALL  EXAM: LEFT SHOULDER - 2+ VIEW  COMPARISON:  None.  FINDINGS: There is a comminuted fracture of the surgical neck of the left proximal humerus with mild impaction with mild anterior displacement. There is no glenohumeral dislocation.  There is left carotid artery atherosclerosis.  IMPRESSION: Comminuted fracture of the surgical neck of the left proximal humerus.   Electronically Signed   By: Kathreen Devoid   On: 06/01/2013 14:09    Assessment/Plan Principal Problem:   Hip fracture Active Problems:   Atrial fibrillation   Chronic diastolic heart failure  Hip fracture I discussed in detail with patient and her son and grandson at bedside. Patient is currently in hospice care, but would like to discuss with orthopedics her options which include surgery. Patient and her family would make the decision after talking to orthopedic surgery. I've called and discussed with Dr. Tamera Punt who will see the patient today to discuss the options. In the meantime I will admit the patient on MedSurg floor. We'll give fentanyl 12.5 IV every 2 hours when necessary for pain. Patient is not in any pain at this time. I will continue all the home medications will hold the Lasix at this time due to worsening BUN and  creatinine   Code status: DO NOT RESUSCITATE  Family discussion: Discussed with son and grandson at bedside   Time Spent on Admission: 47 min  [facility] Pager: [phone] 06/01/2013, 5:25 PM  If 7PM-7AM, please  contact night-coverage  [email or web address]  Password [email or web address]

## 2013-06-01 NOTE — ED Notes (Signed)
Pt in xray

## 2013-06-01 NOTE — Progress Notes (Signed)
Attempt to call pts son Boykin NearingVance un-successful at [phone].talked with pts granddtr= Cynda Familiaeresa Gurley at [phone] she confirmed that pt is a DNR status,and was being seen by Hospice prior to this admission. Linward HeadlandBeverly, Takhia Spoon D

## 2013-06-01 NOTE — Progress Notes (Signed)
Orthopedic Tech Progress Note Patient Details:  Leeanne RioFleeta M Deery 1911-06-15 [id number]  Ortho Devices Type of Ortho Device: Sling immobilizer Ortho Device/Splint Location: lue Ortho Device/Splint Interventions: Application   Vylette Strubel 06/01/2013, 6:50 PM

## 2013-06-01 NOTE — ED Notes (Signed)
Pt sating 85% on RA; placed on 2L [place]; now sating 92-99%

## 2013-06-01 NOTE — ED Notes (Signed)
Pt stating that she needs to go to the bathroom.  This RN informed pt that she had a foley catheter in place.

## 2013-06-01 NOTE — ED Provider Notes (Signed)
CSN: [id number]     Arrival date & time 06/01/13  1200 History   First MD Initiated Contact with Patient 06/01/13 1223     Chief Complaint  Patient presents with  . Shoulder Injury  . Hip Injury  . Fall   (Consider location/radiation/quality/duration/timing/severity/associated sxs/prior Treatment) HPI  80 year old female brought in by EMS. Sinus at bedside. He reports multiple falls over the past 2 days. After falling today he was unable to get her up. The patient's complaint pain in her left shoulder or left hip. Denies any headache, neck or back pain. No blood thinners aside from the baby aspirin. She cannot tell me how or why she fell. 2mg  morphine by EMS prior to arrival.  Past Medical History  Diagnosis Date  . Peripheral vascular disease   . Peripheral edema   . COPD (chronic obstructive pulmonary disease)   . Hypertension   . Macular degeneration     bilateral  . Irritable bladder   . Rectal fissure   . Enteritis   . Duodenitis   . Esophagitis   . DJD (degenerative joint disease)   . Dyspepsia   . GERD (gastroesophageal reflux disease)    Past Surgical History  Procedure Laterality Date  . Mole excision    . Cataract surg    . Laparotomy for treatment of adhession    . Appendectomy     No family history on file. History  Substance Use Topics  . Smoking status: Never Smoker   . Smokeless tobacco: Never Used  . Alcohol Use: No   OB History   Grav Para Term Preterm Abortions TAB SAB Ect Mult Living                 Review of Systems  All systems reviewed and negative, other than as noted in HPI.   Allergies  Codone and Codeine  Home Medications   Current Outpatient Rx  Name  Route  Sig  Dispense  Refill  . acetaminophen (TYLENOL) 650 MG CR tablet   Oral   Take 650 mg by mouth every 8 (eight) hours as needed for pain.         Marland Kitchen aspirin 81 MG tablet   Oral   Take 81 mg by mouth daily.           . Calcium Carbonate-Vitamin D (CALTRATE 600+D  PO)   Oral   Take 1 tablet by mouth 2 (two) times daily.         . cilostazol (PLETAL) 100 MG tablet      TAKE ONE TABLET BY MOUTH TWICE DAILY   180 tablet   1   . diltiazem (CARDIZEM CD) 240 MG 24 hr capsule   Oral   Take 1 capsule (240 mg total) by mouth daily.   30 capsule   11   . furosemide (LASIX) 40 MG tablet   Oral   Take 1 tablet (40 mg total) by mouth 2 (two) times daily.   180 tablet   1   . metoprolol tartrate (LOPRESSOR) 50 MG tablet   Oral   Take 1 tablet (50 mg total) by mouth 2 (two) times daily.   60 tablet   11   . oxybutynin (DITROPAN) 5 MG tablet   Oral   Take 5 mg by mouth daily.         Bertram Gala Glycol-Propyl Glycol (SYSTANE OP)   Both Eyes   Place 1 drop into both eyes every morning.         Marland Kitchen  potassium chloride SA (K-DUR,KLOR-CON) 20 MEQ tablet   Oral   Take 1 tablet (20 mEq total) by mouth daily.   30 tablet   11   . senna (SENOKOT) 8.6 MG TABS tablet   Oral   Take 1 tablet (8.6 mg total) by mouth 2 (two) times daily.   120 each   0    BP 170/76  Pulse 120  Temp(Src) 98.8 F (37.1 C) (Oral)  Resp 18  SpO2 100% Physical Exam  Nursing note and vitals reviewed. Constitutional: She appears well-developed and well-nourished. No distress.  HENT:  Head: Normocephalic and atraumatic.  Eyes: Conjunctivae are normal. Right eye exhibits no discharge. Left eye exhibits no discharge.  Neck: Neck supple.  Cardiovascular: Normal rate, regular rhythm and normal heart sounds.  Exam reveals no gallop and no friction rub.   No murmur heard. Pulmonary/Chest: Effort normal and breath sounds normal. No respiratory distress.  Abdominal: Soft. She exhibits no distension. There is no tenderness.  Musculoskeletal: She exhibits no edema and no tenderness.  Deformity/ecchymosis/swelling proximal L humerus/shoulder. LLE shortened and externally rotated. Severe pain with attempted ROM. No midline spinal tenderness.  Neurological: She is alert.   Skin: Skin is warm and dry.  Psychiatric: She has a normal mood and affect. Her behavior is normal. Thought content normal.    ED Course  Procedures (including critical care time) Labs Review Labs Reviewed  CBC WITH DIFFERENTIAL - Abnormal; Notable for the following:    RBC 3.69 (*)    Hemoglobin 10.6 (*)    HCT 32.2 (*)    RDW 16.4 (*)    All other components within normal limits  BASIC METABOLIC PANEL - Abnormal; Notable for the following:    Glucose, Bld 123 (*)    BUN 25 (*)    Creatinine, Ser 1.41 (*)    GFR calc non Af Amer 30 (*)    GFR calc Af Amer 34 (*)    All other components within normal limits  CBC - Abnormal; Notable for the following:    RBC 3.22 (*)    Hemoglobin 9.3 (*)    HCT 28.5 (*)    RDW 16.7 (*)    All other components within normal limits  COMPREHENSIVE METABOLIC PANEL - Abnormal; Notable for the following:    Glucose, Bld 115 (*)    BUN 25 (*)    Creatinine, Ser 1.29 (*)    Albumin 2.8 (*)    GFR calc non Af Amer 33 (*)    GFR calc Af Amer 38 (*)    All other components within normal limits  URINALYSIS, ROUTINE W REFLEX MICROSCOPIC   Imaging Review No results found.  Dg Chest 1 View  06/01/2013   CLINICAL DATA:  SHOULDER INJURY HIP INJURY FALL  EXAM: CHEST - 1 VIEW  COMPARISON:  DG CHEST 2 VIEW dated 01/15/2013; DG CHEST 2 VIEW dated 01/09/2013; DG CHEST 2 VIEW dated 01/13/2013; CT ANGIO CHEST W/CM &/OR WO/CM dated 01/11/2013  FINDINGS: There is no focal parenchymal opacity, pleural effusion, or pneumothorax. There is stable cardiomegaly.  The osseous structures are unremarkable.  IMPRESSION: No active disease.   Electronically Signed   By: Elige Ko   On: 06/01/2013 14:10   Dg Hip Complete Left  06/01/2013   CLINICAL DATA:  SHOULDER INJURY HIP INJURY FALL  EXAM: LEFT HIP - COMPLETE 2+ VIEW  COMPARISON:  No comparisons  FINDINGS: There is a comminuted left intertrochanteric fracture with mild displacement and angulation. There  is no left hip  dislocation.  There is no right hip fracture or dislocation. There are mild degenerative changes of the right hip.  There is generalized osteopenia.  IMPRESSION: Comminuted and displaced left intertrochanteric fracture.   Electronically Signed   By: Elige KoHetal  Patel   On: 06/01/2013 14:08   Dg Shoulder Left  06/01/2013   CLINICAL DATA:  SHOULDER INJURY HIP INJURY FALL  EXAM: LEFT SHOULDER - 2+ VIEW  COMPARISON:  None.  FINDINGS: There is a comminuted fracture of the surgical neck of the left proximal humerus with mild impaction with mild anterior displacement. There is no glenohumeral dislocation.  There is left carotid artery atherosclerosis.  IMPRESSION: Comminuted fracture of the surgical neck of the left proximal humerus.   Electronically Signed   By: Elige KoHetal  Patel   On: 06/01/2013 14:09   EKG Interpretation    Date/Time:  Saturday June 01 2013 12:14:36 EST Ventricular Rate:  112 PR Interval:    QRS Duration: 68 QT Interval:  348 QTC Calculation: 475 R Axis:   68 Text Interpretation:  Age not entered, assumed to be  80 years old for purpose of ECG interpretation Atrial fibrillation Anterior infarct, old Minimal ST depression, lateral leads ED PHYSICIAN INTERPRETATION AVAILABLE IN [facility] Confirmed by TEST, RECORD ([id number]) on 06/03/2013 7:39:38 AM            MDM   1. Fracture of humerus, closed, left, initial encounter   2. Closed left hip fracture, initial encounter   3. Atrial fibrillation   4. Chronic airway obstruction, not elsewhere classified   5. Chronic diastolic heart failure   6. Osteoarthrosis, unspecified whether generalized or localized, unspecified site   7. Unspecified essential hypertension   8. MACULAR DEGENERATION, BILATERAL   9. HYPERTENSION   10. PERIPHERAL VASCULAR DISEASE   11. COPD   12. GERD   13. DYSPEPSIA   14. Other specified disorders of bladder   15. DEGENERATIVE JOINT DISEASE   16. PERIPHERAL EDEMA   17. ESOPHAGITIS, HX OF   18. End of  life care     80 year old female with multiple recent falls. Workup significant for her the left hip and left humerus fracture. Patient is on hospice for end of life care, but lives with 78 year old son and no around the clock care. Will admit    Raeford RazorStephen Deem Marmol, MD 06/05/13 0900

## 2013-06-01 NOTE — Consult Note (Signed)
Seen with Duwayne Heckanielle and agree with above.    Patient is elderly and certainly has risk associated with surgery, however she will not walk again without surgery.  Family is considering options of hospice care or surgical management.  I think if patient has expected lifespan of greater than a few weeks surgery may be beneficial to stabilize fracture and improve pain control with transfers, etc.  I had a long discussion with the family at her bedside and they would like to talk it over as a family and with the patient.  We will reassess tomorrow and will leave her NPO after midnight in case they would like to go forward with surgery.  Recommend nonop management of proximal humerus fracture with a sling.

## 2013-06-01 NOTE — Consult Note (Signed)
Reason for Consult:left hip, left shoulder pain after fall  Referring Physician: [name], [name] is an 80 y.o. female.  HPI: 80 yo female in ED with son and grandson. History reported by son. At baseline, patient lives with son, is ambulatory with walker/cane and otherwise independent. Has a 3 day history of change in cognitive status, son reports hallucinating and reaching for nonexistent objects, increased confusion. Has had multiple falls in the last few days, last fall earlier today resulting in left shoulder and left hip pain. Taken to ED. History of CHF and recent hospitalization for symptoms.   Past Medical History  Diagnosis Date  . Peripheral vascular disease   . Peripheral edema   . COPD (chronic obstructive pulmonary disease)   . Hypertension   . Macular degeneration     bilateral  . Irritable bladder   . Rectal fissure   . Enteritis   . Duodenitis   . Esophagitis   . DJD (degenerative joint disease)   . Dyspepsia   . GERD (gastroesophageal reflux disease)     Past Surgical History  Procedure Laterality Date  . Mole excision    . Cataract surg    . Laparotomy for treatment of adhession    . Appendectomy      No family history on file.  Social History:  reports that she has never smoked. She has never used smokeless tobacco. She reports that she does not drink alcohol or use illicit drugs.  Allergies:  Allergies  Allergen Reactions  . Codone [Hydrocodone]     hallucinations  . Codeine Other (See Comments)    Hallucinations/abnormal behavior Per family member pt can not tolerate strong pain medications    Medications: I have reviewed the patient's current medications.  Results for orders placed during the hospital encounter of 06/01/13 (from the past 48 hour(s))  CBC WITH DIFFERENTIAL     Status: Abnormal   Collection Time    06/01/13  3:03 PM      Result Value Range   WBC 8.1  4.0 - 10.5 K/uL   RBC 3.69 (*) 3.87 - 5.11 MIL/uL   Hemoglobin 10.6  (*) 12.0 - 15.0 g/dL   HCT 32.2 (*) 36.0 - 46.0 %   MCV 87.3  78.0 - 100.0 fL   MCH 28.7  26.0 - 34.0 pg   MCHC 32.9  30.0 - 36.0 g/dL   RDW 16.4 (*) 11.5 - 15.5 %   Platelets 189  150 - 400 K/uL   Neutrophils Relative % 68  43 - 77 %   Neutro Abs 5.5  1.7 - 7.7 K/uL   Lymphocytes Relative 20  12 - 46 %   Lymphs Abs 1.7  0.7 - 4.0 K/uL   Monocytes Relative 11  3 - 12 %   Monocytes Absolute 0.9  0.1 - 1.0 K/uL   Eosinophils Relative 0  0 - 5 %   Eosinophils Absolute 0.0  0.0 - 0.7 K/uL   Basophils Relative 0  0 - 1 %   Basophils Absolute 0.0  0.0 - 0.1 K/uL  BASIC METABOLIC PANEL     Status: Abnormal   Collection Time    06/01/13  3:03 PM      Result Value Range   Sodium 141  137 - 147 mEq/L   Comment: Please note change in reference range.   Potassium 4.4  3.7 - 5.3 mEq/L   Comment: Please note change in reference range.   Chloride 100  96 - 112 mEq/L   CO2 29  19 - 32 mEq/L   Glucose, Bld 123 (*) 70 - 99 mg/dL   BUN 25 (*) 6 - 23 mg/dL   Creatinine, Ser 1.41 (*) 0.50 - 1.10 mg/dL   Calcium 9.0  8.4 - 10.5 mg/dL   GFR calc non Af Amer 30 (*) >90 mL/min   GFR calc Af Amer 34 (*) >90 mL/min   Comment: (NOTE)     The eGFR has been calculated using the CKD EPI equation.     This calculation has not been validated in all clinical situations.     eGFR's persistently <90 mL/min signify possible Chronic Kidney     Disease.  URINALYSIS, ROUTINE W REFLEX MICROSCOPIC     Status: None   Collection Time    06/01/13  3:04 PM      Result Value Range   Color, Urine YELLOW  YELLOW   APPearance CLEAR  CLEAR   Specific Gravity, Urine 1.015  1.005 - 1.030   pH 5.5  5.0 - 8.0   Glucose, UA NEGATIVE  NEGATIVE mg/dL   Hgb urine dipstick NEGATIVE  NEGATIVE   Bilirubin Urine NEGATIVE  NEGATIVE   Ketones, ur NEGATIVE  NEGATIVE mg/dL   Protein, ur NEGATIVE  NEGATIVE mg/dL   Urobilinogen, UA 0.2  0.0 - 1.0 mg/dL   Nitrite NEGATIVE  NEGATIVE   Leukocytes, UA NEGATIVE  NEGATIVE   Comment:  MICROSCOPIC NOT DONE ON URINES WITH NEGATIVE PROTEIN, BLOOD, LEUKOCYTES, NITRITE, OR GLUCOSE <1000 mg/dL.    Dg Chest 1 View  06/01/2013   CLINICAL DATA:  SHOULDER INJURY HIP INJURY FALL  EXAM: CHEST - 1 VIEW  COMPARISON:  DG CHEST 2 VIEW dated 01/15/2013; DG CHEST 2 VIEW dated 01/09/2013; DG CHEST 2 VIEW dated 01/13/2013; CT ANGIO CHEST W/CM &/OR WO/CM dated 01/11/2013  FINDINGS: There is no focal parenchymal opacity, pleural effusion, or pneumothorax. There is stable cardiomegaly.  The osseous structures are unremarkable.  IMPRESSION: No active disease.   Electronically Signed   By: Kathreen Devoid   On: 06/01/2013 14:10   Dg Hip Complete Left  06/01/2013   CLINICAL DATA:  SHOULDER INJURY HIP INJURY FALL  EXAM: LEFT HIP - COMPLETE 2+ VIEW  COMPARISON:  No comparisons  FINDINGS: There is a comminuted left intertrochanteric fracture with mild displacement and angulation. There is no left hip dislocation.  There is no right hip fracture or dislocation. There are mild degenerative changes of the right hip.  There is generalized osteopenia.  IMPRESSION: Comminuted and displaced left intertrochanteric fracture.   Electronically Signed   By: Kathreen Devoid   On: 06/01/2013 14:08   Dg Shoulder Left  06/01/2013   CLINICAL DATA:  SHOULDER INJURY HIP INJURY FALL  EXAM: LEFT SHOULDER - 2+ VIEW  COMPARISON:  None.  FINDINGS: There is a comminuted fracture of the surgical neck of the left proximal humerus with mild impaction with mild anterior displacement. There is no glenohumeral dislocation.  There is left carotid artery atherosclerosis.  IMPRESSION: Comminuted fracture of the surgical neck of the left proximal humerus.   Electronically Signed   By: Kathreen Devoid   On: 06/01/2013 14:09    Review of Systems  Unable to perform ROS Musculoskeletal:       Left shoulder pain Left hip pain per patient's son   Blood pressure 173/81, pulse 107, temperature 98.8 F (37.1 C), temperature source Oral, resp. rate 18, SpO2  92.00%. Physical Exam  Constitutional:  She appears well-developed and well-nourished. She appears lethargic.  HENT:  Head: Normocephalic and atraumatic.  Eyes: Conjunctivae are normal.  Neck: Normal range of motion.  Cardiovascular: Intact distal pulses.   Respiratory: Effort normal.  Musculoskeletal:  TTP left hip and left shoulder Left L LE shortened and externally rotated, distally NVI Left shoulder with swelling, pain with any mvmt, distally NV  Neurological: She appears lethargic.  Psychiatric: Cognition and memory are impaired.  Hallucinating in the room Not verbally responsive to verbal stimuli    Assessment/Plan: 1. Left hip comminuted and displace intertroch fx- discussed with son and grandson operative vs. conservative tx. Discussed risks and benefits of left IM femoral nail and the patients current baseline lifestyle and comorbidities. Prior to fall, patient was ambulatory and relatively independent, although she recently has had a change in cognitive status. Uncertain the cause of this change in mental status or if this would improve to baseline after the surgery. Significant risk of complications of surgery secondary to multiple comorbidities. In addition, rehab will be more difficult due to left prox humerus fracture and nonweightbearing status of left arm.  Son will talk to family and determine what they agree to be appropriate treatment.  - NPO after midnight and will follow up in the morning 2. Left prox humerus fracture- recommend nonop mgmt, sling   Jordyan Hardiman 06/01/2013, 5:58 PM

## 2013-06-01 NOTE — ED Notes (Addendum)
Pt lives with son.  Per son, pt fell x 2 yesterday with no obvious injury.  Pt fell again today and unable to get up.  EMS called to assist with getting pt up.  Obvious deformity noted to L arm/shoulder.  Pt also c/o L hip pain.  EMS administered 2mg  Morphine.  Pt states she is unable to rate pain currently.  DNR paperwork at nurse's station.

## 2013-06-01 NOTE — ED Notes (Signed)
Pt hallucinating; will continue to hold pain medication.  Pt currently not reporting any pain.

## 2013-06-02 DIAGNOSIS — M1990 Unspecified osteoarthritis, unspecified site: Secondary | ICD-10-CM

## 2013-06-02 DIAGNOSIS — I10 Essential (primary) hypertension: Secondary | ICD-10-CM

## 2013-06-02 LAB — CBC
HEMATOCRIT: 28.5 % — AB (ref 36.0–46.0)
HEMOGLOBIN: 9.3 g/dL — AB (ref 12.0–15.0)
MCH: 28.9 pg (ref 26.0–34.0)
MCHC: 32.6 g/dL (ref 30.0–36.0)
MCV: 88.5 fL (ref 78.0–100.0)
PLATELETS: 188 10*3/uL (ref 150–400)
RBC: 3.22 MIL/uL — ABNORMAL LOW (ref 3.87–5.11)
RDW: 16.7 % — ABNORMAL HIGH (ref 11.5–15.5)
WBC: 7.1 10*3/uL (ref 4.0–10.5)

## 2013-06-02 LAB — COMPREHENSIVE METABOLIC PANEL
ALT: 9 U/L (ref 0–35)
AST: 18 U/L (ref 0–37)
Albumin: 2.8 g/dL — ABNORMAL LOW (ref 3.5–5.2)
Alkaline Phosphatase: 66 U/L (ref 39–117)
BILIRUBIN TOTAL: 0.5 mg/dL (ref 0.3–1.2)
BUN: 25 mg/dL — ABNORMAL HIGH (ref 6–23)
CALCIUM: 8.5 mg/dL (ref 8.4–10.5)
CHLORIDE: 101 meq/L (ref 96–112)
CO2: 26 meq/L (ref 19–32)
CREATININE: 1.29 mg/dL — AB (ref 0.50–1.10)
GFR, EST AFRICAN AMERICAN: 38 mL/min — AB (ref >90)
GFR, EST NON AFRICAN AMERICAN: 33 mL/min — AB (ref >90)
GLUCOSE: 115 mg/dL — AB (ref 70–99)
Potassium: 4.3 mEq/L (ref 3.7–5.3)
Sodium: 140 mEq/L (ref 137–147)
Total Protein: 6.1 g/dL (ref 6.0–8.3)

## 2013-06-02 MED ORDER — TRAMADOL HCL 50 MG PO TABS: 50.0000 mg | ORAL_TABLET | Freq: Four times a day (QID) | ORAL | Status: DC | PRN

## 2013-06-02 MED ORDER — TRAMADOL HCL 50 MG PO TABS
50.0000 mg | ORAL_TABLET | Freq: Four times a day (QID) | ORAL | Status: DC
  Administered 2013-06-02 – 2013-06-03 (×2): 50 mg via ORAL
  Filled 2013-06-02 (×2): qty 1

## 2013-06-02 NOTE — Progress Notes (Signed)
[facility] PROGRESS NOTE  [name] XBJ:[id number] DOB: 08/05/1911 DOA: 06/01/2013 PCP: Illene Regulus, MD  Assessment/Plan: 1. Hip fracture 1. Family to decide on hospice vs. surgery. Ortho following. 2. Cont pain meds as needed 3. Bloodwork, vitals reviewed. CXR and recent EKG reviewed. Should family request surgery, pt is medically stable at this time. 2. afib 1. Mildly tachycardic - hr in the 110's overnight, related to pain? 2. Cont cardizem with pain meds 3. Had been on ASA 3. Hx diastolic CHF 1. Euvolemic currently 2. Monitor for now  Code Status: DNR Family Communication: Pt in room (indicate person spoken with, relationship, and if by phone, the number) Disposition Plan: Pending   Consultants:  Orthopedic surgery  HPI/Subjective: No complaints. Feels well.  Objective: Filed Vitals:   06/01/13 1715 06/01/13 1800 06/01/13 2303 06/02/13 0516  BP: 173/81 176/74 136/67 139/80  Pulse: 107 124 112 112  Temp:   97.6 F (36.4 C) 97.9 F (36.6 C)  TempSrc:   Axillary Oral  Resp:   18 18  SpO2: 92% 98% 100% 100%    Intake/Output Summary (Last 24 hours) at 06/02/13 1008 Last data filed at 06/02/13 0517  Gross per 24 hour  Intake      3 ml  Output    450 ml  Net   -447 ml   There were no vitals filed for this visit.  Exam:   General:  Awake, in nad  Cardiovascular: regular, s1, s2  Respiratory: normal resp effort, no wheezing  Abdomen: soft, nondistende  Musculoskeletal: perfused, no clubbing   Data Reviewed: Basic Metabolic Panel:  Recent Labs Lab 06/01/13 1503 06/02/13 0627  NA 141 140  K 4.4 4.3  CL 100 101  CO2 29 26  GLUCOSE 123* 115*  BUN 25* 25*  CREATININE 1.41* 1.29*  CALCIUM 9.0 8.5   Liver Function Tests:  Recent Labs Lab 06/02/13 0627  AST 18  ALT 9  ALKPHOS 66  BILITOT 0.5  PROT 6.1  ALBUMIN 2.8*   No results found for this basename: LIPASE, AMYLASE,  in the last 168 hours No results found for this  basename: AMMONIA,  in the last 168 hours CBC:  Recent Labs Lab 06/01/13 1503 06/02/13 0627  WBC 8.1 7.1  NEUTROABS 5.5  --   HGB 10.6* 9.3*  HCT 32.2* 28.5*  MCV 87.3 88.5  PLT 189 188   Cardiac Enzymes: No results found for this basename: CKTOTAL, CKMB, CKMBINDEX, TROPONINI,  in the last 168 hours BNP (last 3 results)  Recent Labs  01/11/13 0645 01/12/13 0600 01/16/13 0500  PROBNP 1845.0* 2078.0* 2372.0*   CBG: No results found for this basename: GLUCAP,  in the last 168 hours  No results found for this or any previous visit (from the past 240 hour(s)).   Studies: Dg Chest 1 View  06/01/2013   CLINICAL DATA:  SHOULDER INJURY HIP INJURY FALL  EXAM: CHEST - 1 VIEW  COMPARISON:  DG CHEST 2 VIEW dated 01/15/2013; DG CHEST 2 VIEW dated 01/09/2013; DG CHEST 2 VIEW dated 01/13/2013; CT ANGIO CHEST W/CM &/OR WO/CM dated 01/11/2013  FINDINGS: There is no focal parenchymal opacity, pleural effusion, or pneumothorax. There is stable cardiomegaly.  The osseous structures are unremarkable.  IMPRESSION: No active disease.   Electronically Signed   By: Elige Ko   On: 06/01/2013 14:10   Dg Hip Complete Left  06/01/2013   CLINICAL DATA:  SHOULDER INJURY HIP INJURY FALL  EXAM: LEFT HIP - COMPLETE  2+ VIEW  COMPARISON:  No comparisons  FINDINGS: There is a comminuted left intertrochanteric fracture with mild displacement and angulation. There is no left hip dislocation.  There is no right hip fracture or dislocation. There are mild degenerative changes of the right hip.  There is generalized osteopenia.  IMPRESSION: Comminuted and displaced left intertrochanteric fracture.   Electronically Signed   By: Elige KoHetal  Patel   On: 06/01/2013 14:08   Dg Shoulder Left  06/01/2013   CLINICAL DATA:  SHOULDER INJURY HIP INJURY FALL  EXAM: LEFT SHOULDER - 2+ VIEW  COMPARISON:  None.  FINDINGS: There is a comminuted fracture of the surgical neck of the left proximal humerus with mild impaction with mild anterior  displacement. There is no glenohumeral dislocation.  There is left carotid artery atherosclerosis.  IMPRESSION: Comminuted fracture of the surgical neck of the left proximal humerus.   Electronically Signed   By: Elige KoHetal  Patel   On: 06/01/2013 14:09    Scheduled Meds: . diltiazem  240 mg Oral Daily  . enoxaparin (LOVENOX) injection  30 mg Subcutaneous Q24H  . metoprolol tartrate  50 mg Oral BID  . oxybutynin  5 mg Oral Daily  . senna  1 tablet Oral BID  . sodium chloride  3 mL Intravenous Q12H   Continuous Infusions:   Principal Problem:   Hip fracture Active Problems:   Atrial fibrillation   Chronic diastolic heart failure    Time spent: 35min    Kalliope Riesen K  [facility] Pager [phone]. If 7PM-7AM, please contact night-coverage at [email or web address], password [email or web address] 06/02/2013, 10:08 AM  LOS: 1 day

## 2013-06-02 NOTE — Progress Notes (Addendum)
Clinical Social Worker (CSW) met with patient's granddaughter Willadean Carol [phone] who reported that patient has been getting hospice services at home through [facility] since August 2014. Granddaughter also reported that the patient has a bed at [facility]. CSW contacted Maudie Mercury, Therapist, sports at [facility] residential hospice who asked CSW to fax patient's history and physical, recent progress note, demographic sheet, and medication list. CSW faxed the requested information to Maudie Mercury, RN who confirmed that she received the paper work. Maudie Mercury, RN reported that patient could transfer to the residential hospice facility on Monday and they have 4 beds available. CSW made granddaughter aware of above. Weekday CSW will assist with D/C on Monday 06/03/13.  Patient's granddaughter: Willadean Carol [phone] [place]: [phone]   Blima Rich, Latanya Presser [place] [place]

## 2013-06-02 NOTE — Progress Notes (Signed)
I just had a long discussion with the patient's granddaughters at her bedside in her presence. The family feels at this point she will do best with hospice care and I would agree with that assessment. They have a place picked out and hope that she will be able to be transferred there tomorrow. In the meantime she will be comfort care. I will sign off, but I would be happy to come back and see her again if necessary.

## 2013-06-02 NOTE — Progress Notes (Signed)
PATIENT ID: Nathaniel ManFleeta M Clarin        Subjective: Lying in bed. No new complaints. Pain seems well controlled.  Objective:  Filed Vitals:   06/02/13 0516  BP: 139/80  Pulse: 112  Temp: 97.9 F (36.6 C)  Resp: 18     She is verbally interactive and appropriate. Left upper extremity is in a sling. Left lower extremity shortened. Distally neurovascularly intact.  Labs:   Recent Labs  06/01/13 1503 06/02/13 0627  HGB 10.6* 9.3*   Recent Labs  06/01/13 1503 06/02/13 0627  WBC 8.1 7.1  RBC 3.69* 3.22*  HCT 32.2* 28.5*  PLT 189 188   Recent Labs  06/01/13 1503 06/02/13 0627  NA 141 140  K 4.4 4.3  CL 100 101  CO2 29 26  BUN 25* 25*  CREATININE 1.41* 1.29*  GLUCOSE 123* 115*  CALCIUM 9.0 8.5    Assessment and Plan: Left proximal humerus fracture, left reverse obliquity intertrochanteric fracture Awaiting family's decision regarding surgical versus hospice care. Will follow along. N.p.o. for now.

## 2013-06-03 ENCOUNTER — Telehealth: Payer: Self-pay | Admitting: Internal Medicine

## 2013-06-03 DIAGNOSIS — I5032 Chronic diastolic (congestive) heart failure: Secondary | ICD-10-CM

## 2013-06-03 DIAGNOSIS — I4891 Unspecified atrial fibrillation: Secondary | ICD-10-CM

## 2013-06-03 DIAGNOSIS — S42309A Unspecified fracture of shaft of humerus, unspecified arm, initial encounter for closed fracture: Secondary | ICD-10-CM

## 2013-06-03 DIAGNOSIS — S72009A Fracture of unspecified part of neck of unspecified femur, initial encounter for closed fracture: Secondary | ICD-10-CM

## 2013-06-03 DIAGNOSIS — J4489 Other specified chronic obstructive pulmonary disease: Secondary | ICD-10-CM

## 2013-06-03 DIAGNOSIS — J449 Chronic obstructive pulmonary disease, unspecified: Secondary | ICD-10-CM

## 2013-06-03 MED ORDER — TRAMADOL HCL 50 MG PO TABS: 50.0000 mg | ORAL_TABLET | Freq: Four times a day (QID) | ORAL | Status: AC | PRN

## 2013-06-03 NOTE — Telephone Encounter (Signed)
Pt's grand daughter called to inform Dr. Debby BudNorins that [name] is in the hospital room 3N32. Pt fracture her hip and broke her shoulder. Pt's daughter was wondering if Dr. Debby BudNorins can stop by before they transfer her to hospice in [place] today.

## 2013-06-03 NOTE — Discharge Summary (Signed)
Physician Discharge Summary  Patient ID: [name] MRN: [id number] DOB/AGE: 78-Aug-1913 78 y.o.  Admit date: 06/01/2013 Discharge date: 06/03/2013  Primary Care Physician:  Illene Regulus, MD   Discharge Diagnoses:  Principal Problem:  Hip fracture  Active Problems:  Atrial fibrillation  Chronic diastolic heart failure     Allergies:   Allergies  Allergen Reactions  . Codone [Hydrocodone]     hallucinations  . Codeine Other (See Comments)    Hallucinations/abnormal behavior Per family member pt can not tolerate strong pain medications     Discharge Medications:   Medication List         acetaminophen 650 MG CR tablet  Commonly known as:  TYLENOL  Take 650 mg by mouth every 8 (eight) hours as needed for pain.     aspirin 81 MG tablet  Take 81 mg by mouth daily.     CALTRATE 600+D PO  Take 1 tablet by mouth 2 (two) times daily.     cilostazol 100 MG tablet  Commonly known as:  PLETAL  TAKE ONE TABLET BY MOUTH TWICE DAILY     diltiazem 240 MG 24 hr capsule  Commonly known as:  CARDIZEM CD  Take 1 capsule (240 mg total) by mouth daily.     furosemide 40 MG tablet  Commonly known as:  LASIX  Take 1 tablet (40 mg total) by mouth 2 (two) times daily.     metoprolol 50 MG tablet  Commonly known as:  LOPRESSOR  Take 1 tablet (50 mg total) by mouth 2 (two) times daily.     oxybutynin 5 MG tablet  Commonly known as:  DITROPAN  Take 5 mg by mouth daily.     potassium chloride SA 20 MEQ tablet  Commonly known as:  K-DUR,KLOR-CON  Take 1 tablet (20 mEq total) by mouth daily.     senna 8.6 MG Tabs tablet  Commonly known as:  SENOKOT  Take 1 tablet (8.6 mg total) by mouth 2 (two) times daily.     SYSTANE OP  Place 1 drop into both eyes every morning.     traMADol 50 MG tablet  Commonly known as:  ULTRAM  Take 1 tablet (50 mg total) by mouth every 6 (six) hours as needed for moderate pain or severe pain.         Brief H and P: For complete details  please refer to admission H and P, but in brief 80 year old female who has a past medical history of Peripheral vascular disease; Peripheral edema; COPD (chronic obstructive pulmonary disease); Hypertension; Macular degeneration; Irritable bladder; Rectal fissure; Enteritis; Duodenitis; Esophagitis; DJD (degenerative joint disease); Dyspepsia; and GERD (gastroesophageal reflux disease).  Was brought to the ED after patient fell at home. As per patient, who can provide very good history she has been falling over the past 2 days and after today's fall she could not get up. When her son arrived home he tried to lift her hand patient could not stand. So he called EMS, and patient was brought to the hospital and was found to have left hip and left humerus fracture. Patient is currently is under hospice care. She denied pain, no chest pain no shortness of breath no nausea vomiting or diarrhea. She denied passing out.  As per patient's son, she has been having hallucinations at home.  Procedures:  None Consultations:  Orthopedic surgery  Hospital Course:  The patient was admitted to the floor. She was continued on pain meds for symptom  relief. Orthopedic surgery was consulted for consideration for surgery. After admission, discussion was made with family and ultimately, the family decided to resume hospice care and not pursue surgery  History of atrial fibrillation: Currently rate controlled, continue metoprolol with pain medication   Day of Discharge BP 144/60  Pulse 82  Temp(Src) 98.4 F (36.9 C) (Oral)  Resp 18  SpO2 100%  Physical Exam: General: Alert and awake not in any acute distress. CVS: S1-S2 clear no murmur rubs or gallops Chest: clear to auscultation bilaterally, no wheezing rales or rhonchi Abdomen: soft nontender, nondistended, normal bowel sounds Extremities: no cyanosis, clubbing or edema noted bilaterally    The results of significant diagnostics from this hospitalization  (including imaging, microbiology, ancillary and laboratory) are listed below for reference.    LAB RESULTS: Basic Metabolic Panel:  Recent Labs Lab 06/01/13 1503 06/02/13 0627  NA 141 140  K 4.4 4.3  CL 100 101  CO2 29 26  GLUCOSE 123* 115*  BUN 25* 25*  CREATININE 1.41* 1.29*  CALCIUM 9.0 8.5   Liver Function Tests:  Recent Labs Lab 06/02/13 0627  AST 18  ALT 9  ALKPHOS 66  BILITOT 0.5  PROT 6.1  ALBUMIN 2.8*   No results found for this basename: LIPASE, AMYLASE,  in the last 168 hours No results found for this basename: AMMONIA,  in the last 168 hours CBC:  Recent Labs Lab 06/01/13 1503 06/02/13 0627  WBC 8.1 7.1  NEUTROABS 5.5  --   HGB 10.6* 9.3*  HCT 32.2* 28.5*  MCV 87.3 88.5  PLT 189 188   Cardiac Enzymes: No results found for this basename: CKTOTAL, CKMB, CKMBINDEX, TROPONINI,  in the last 168 hours BNP: No components found with this basename: POCBNP,  CBG: No results found for this basename: GLUCAP,  in the last 168 hours  Significant Diagnostic Studies:  Dg Chest 1 View  06/01/2013   CLINICAL DATA:  SHOULDER INJURY HIP INJURY FALL  EXAM: CHEST - 1 VIEW  COMPARISON:  DG CHEST 2 VIEW dated 01/15/2013; DG CHEST 2 VIEW dated 01/09/2013; DG CHEST 2 VIEW dated 01/13/2013; CT ANGIO CHEST W/CM &/OR WO/CM dated 01/11/2013  FINDINGS: There is no focal parenchymal opacity, pleural effusion, or pneumothorax. There is stable cardiomegaly.  The osseous structures are unremarkable.  IMPRESSION: No active disease.   Electronically Signed   By: Elige KoHetal  Patel   On: 06/01/2013 14:10   Dg Hip Complete Left  06/01/2013   CLINICAL DATA:  SHOULDER INJURY HIP INJURY FALL  EXAM: LEFT HIP - COMPLETE 2+ VIEW  COMPARISON:  No comparisons  FINDINGS: There is a comminuted left intertrochanteric fracture with mild displacement and angulation. There is no left hip dislocation.  There is no right hip fracture or dislocation. There are mild degenerative changes of the right hip.  There is  generalized osteopenia.  IMPRESSION: Comminuted and displaced left intertrochanteric fracture.   Electronically Signed   By: Elige KoHetal  Patel   On: 06/01/2013 14:08   Dg Shoulder Left  06/01/2013   CLINICAL DATA:  SHOULDER INJURY HIP INJURY FALL  EXAM: LEFT SHOULDER - 2+ VIEW  COMPARISON:  None.  FINDINGS: There is a comminuted fracture of the surgical neck of the left proximal humerus with mild impaction with mild anterior displacement. There is no glenohumeral dislocation.  There is left carotid artery atherosclerosis.  IMPRESSION: Comminuted fracture of the surgical neck of the left proximal humerus.   Electronically Signed   By: Elige KoHetal  Patel  On: 06/01/2013 14:09      Disposition and Follow-up:     Discharge Orders   Future Orders Complete By Expires   Diet general  As directed    Increase activity slowly  As directed        DISPOSITION: Hospice DIET: Regular   DISCHARGE FOLLOW-UP Follow-up Information   Follow up with Illene Regulus, MD. (As needed as needed if symptoms worsen )    Specialty:  Internal Medicine   Contact information:   [address] [place] [address] [phone]       Time spent on Discharge: 35 mins  Signed:   Charmagne Buhl M.D. [facility] 06/03/2013, 11:36 AM Pager: [phone]

## 2013-06-03 NOTE — Telephone Encounter (Signed)
If she is still on the census this PM after clinic I will go by.

## 2013-06-03 NOTE — Progress Notes (Signed)
CSW (Clinical Child psychotherapistocial Worker) faxed dc summary to facility and spoke with Arline AspCindy to inform of dc. CSW and pt nurse spoke with pt and pt son Faylene MillionVance. Non-emergent ambulance transport was set up for pt. CSW signing off.  Alantra Popoca, LCSWA [phone]

## 2013-06-03 NOTE — Progress Notes (Addendum)
Clinical Social Work Department BRIEF PSYCHOSOCIAL ASSESSMENT 06/03/2013  Patient:  Leeanne RioSAULS,Lyndsay M     Account Number:  [id number]     Admit date:  06/01/2013  Clinical Social Worker:  Harless NakayamaAMBELAL,Aljean Horiuchi, LCSWA  Date/Time:  06/03/2013 12:00 N  Referred by:  Physician  Date Referred:  06/03/2013 Referred for  Residential hospice placement   Other Referral:   Interview type:  Family Other interview type:   Spoke with pt son Faylene MillionVance at bedside    PSYCHOSOCIAL DATA Living Status:  FAMILY Admitted from facility:   Level of care:   Primary support name:  Concepcion Livingeresa Durley [phone] Primary support relationship to patient:  FAMILY Degree of support available:   Pt has very supportive family    CURRENT CONCERNS Current Concerns  Post-Acute Placement   Other Concerns:    SOCIAL WORK ASSESSMENT / PLAN CSW informed pt family would like for pt to dc to hospice facility. CSW confirmed [facility]. Facility requesting pt dc as early as possible. CSW relayed this to MD. CSW spoke with pt son Faylene MillionVance at bedside and updated.   Assessment/plan status:  Psychosocial Support/Ongoing Assessment of Needs Other assessment/ plan:   Information/referral to community resources:   None needed    PATIENT'S/FAMILY'S RESPONSE TO PLAN OF CARE: Pt family is agreeable for pt to dc to residential hospice.      Kitt Ledet, LCSWA [phone]

## 2013-06-03 NOTE — Progress Notes (Signed)
Report given to [place] [facility].

## 2013-06-06 ENCOUNTER — Telehealth: Payer: Self-pay | Admitting: *Deleted

## 2013-06-06 NOTE — Telephone Encounter (Signed)
Called Vance: we're up against [employer] rules: residential hospice for patient with life-expectancy of 30 days or less. Reassured him that in -home hospice will provide a lot of help.

## 2013-06-06 NOTE — Telephone Encounter (Signed)
Son has phoned & family quite concerned b/c the physician [facility] ([facility] is [facility]) is talking about sending patient home in the next few days.  Given patient's bedridden state and immobility, son & family wish to communicate with you & get your opinion.  Please advise any options for this family, per their request.  She fell and broke her hip, her arm and her shoulder over the weekend.  Once d/c'ed from hospital ([facility]) 06/03/13, [facility] at [facility]. Son states she is in no shape to go home because they are unable to adequately care for patient.  CB# for [employer] Didio [phone]

## 2013-06-13 ENCOUNTER — Telehealth: Payer: Self-pay

## 2013-06-13 NOTE — Telephone Encounter (Signed)
Patient past away per O[facility]  °

## 2013-06-30 DEATH — deceased

## 2015-04-27 ENCOUNTER — Encounter: Payer: Self-pay | Admitting: Cardiovascular Disease

## 2015-06-11 ENCOUNTER — Encounter: Payer: Self-pay | Admitting: Gastroenterology

## 2015-06-13 IMAGING — CR DG HIP (WITH OR WITHOUT PELVIS) 2-3V*L*
3 series · 3 of 3 positions shown · non-contrast
Comparison: No comparisons

CLINICAL DATA: SHOULDER INJURY HIP INJURY FALL

EXAM:
LEFT HIP - COMPLETE 2+ VIEW

[t pelvis a.p.]
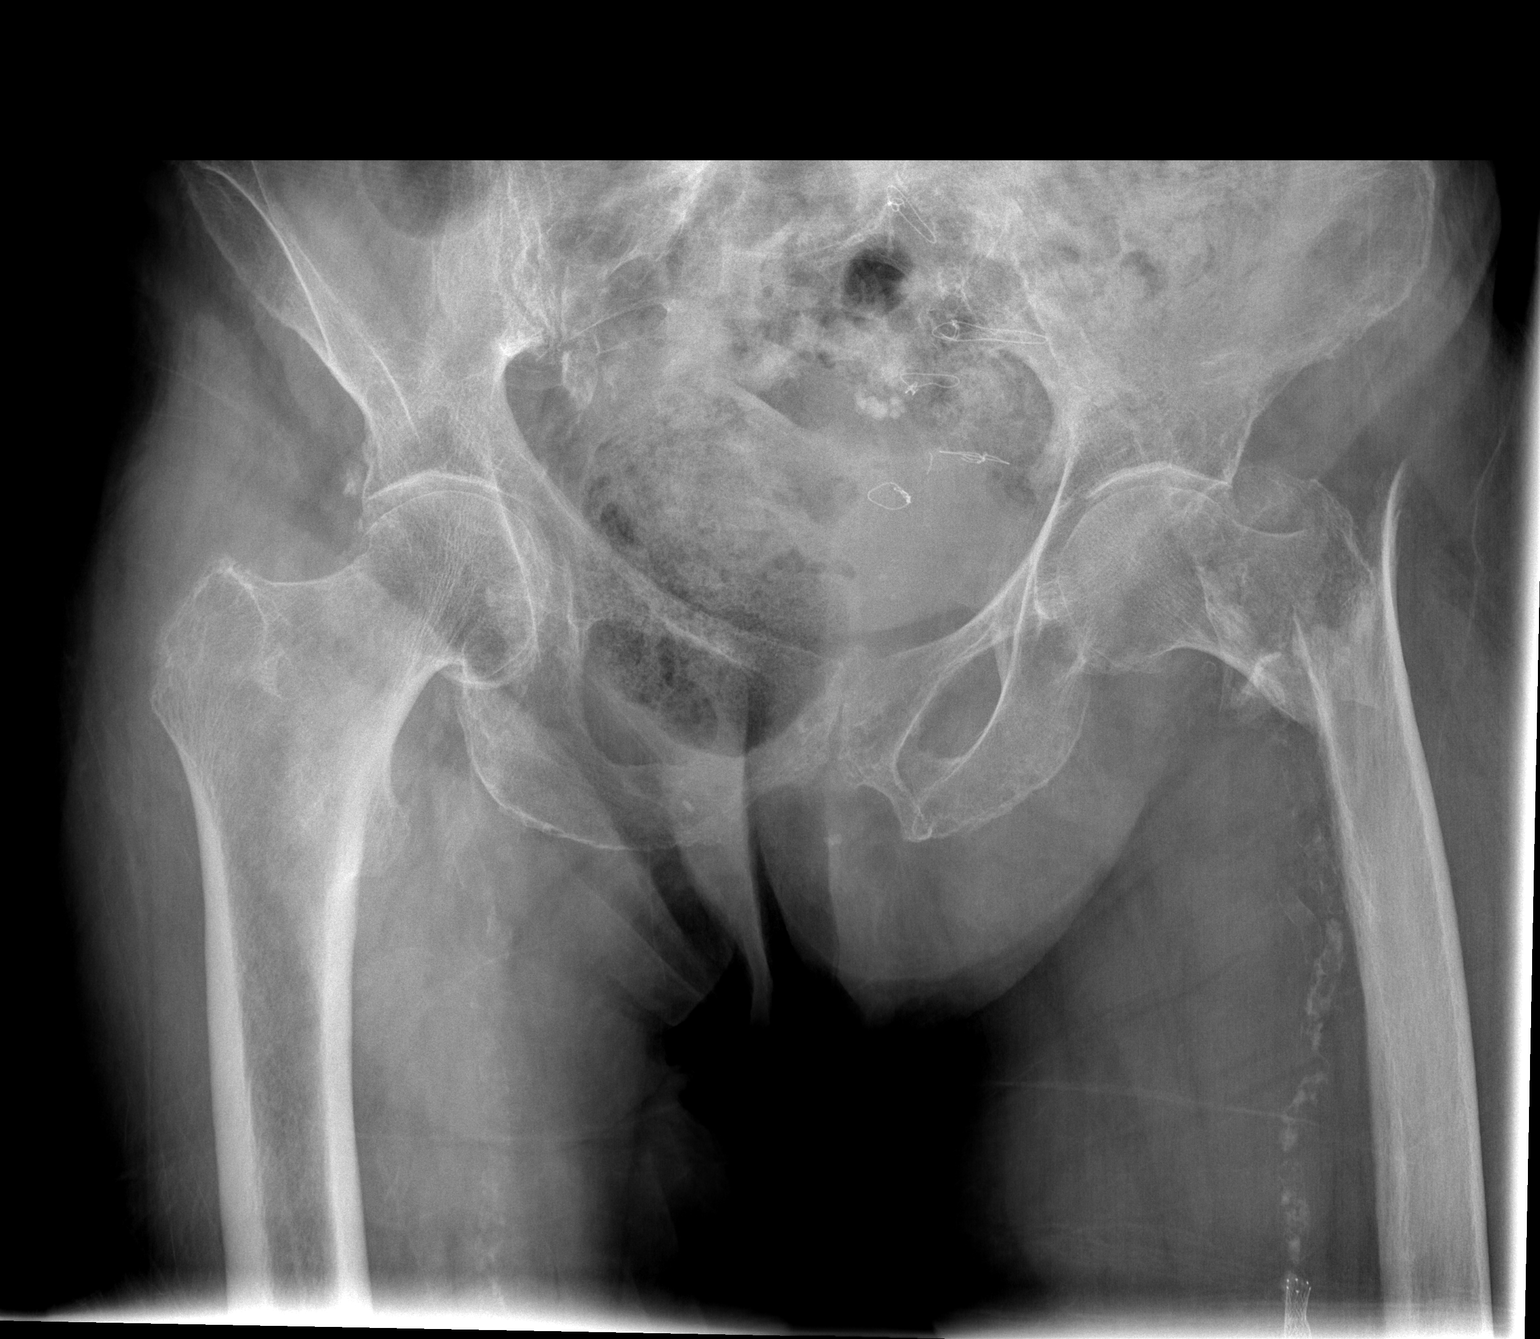

[t hip ap left]
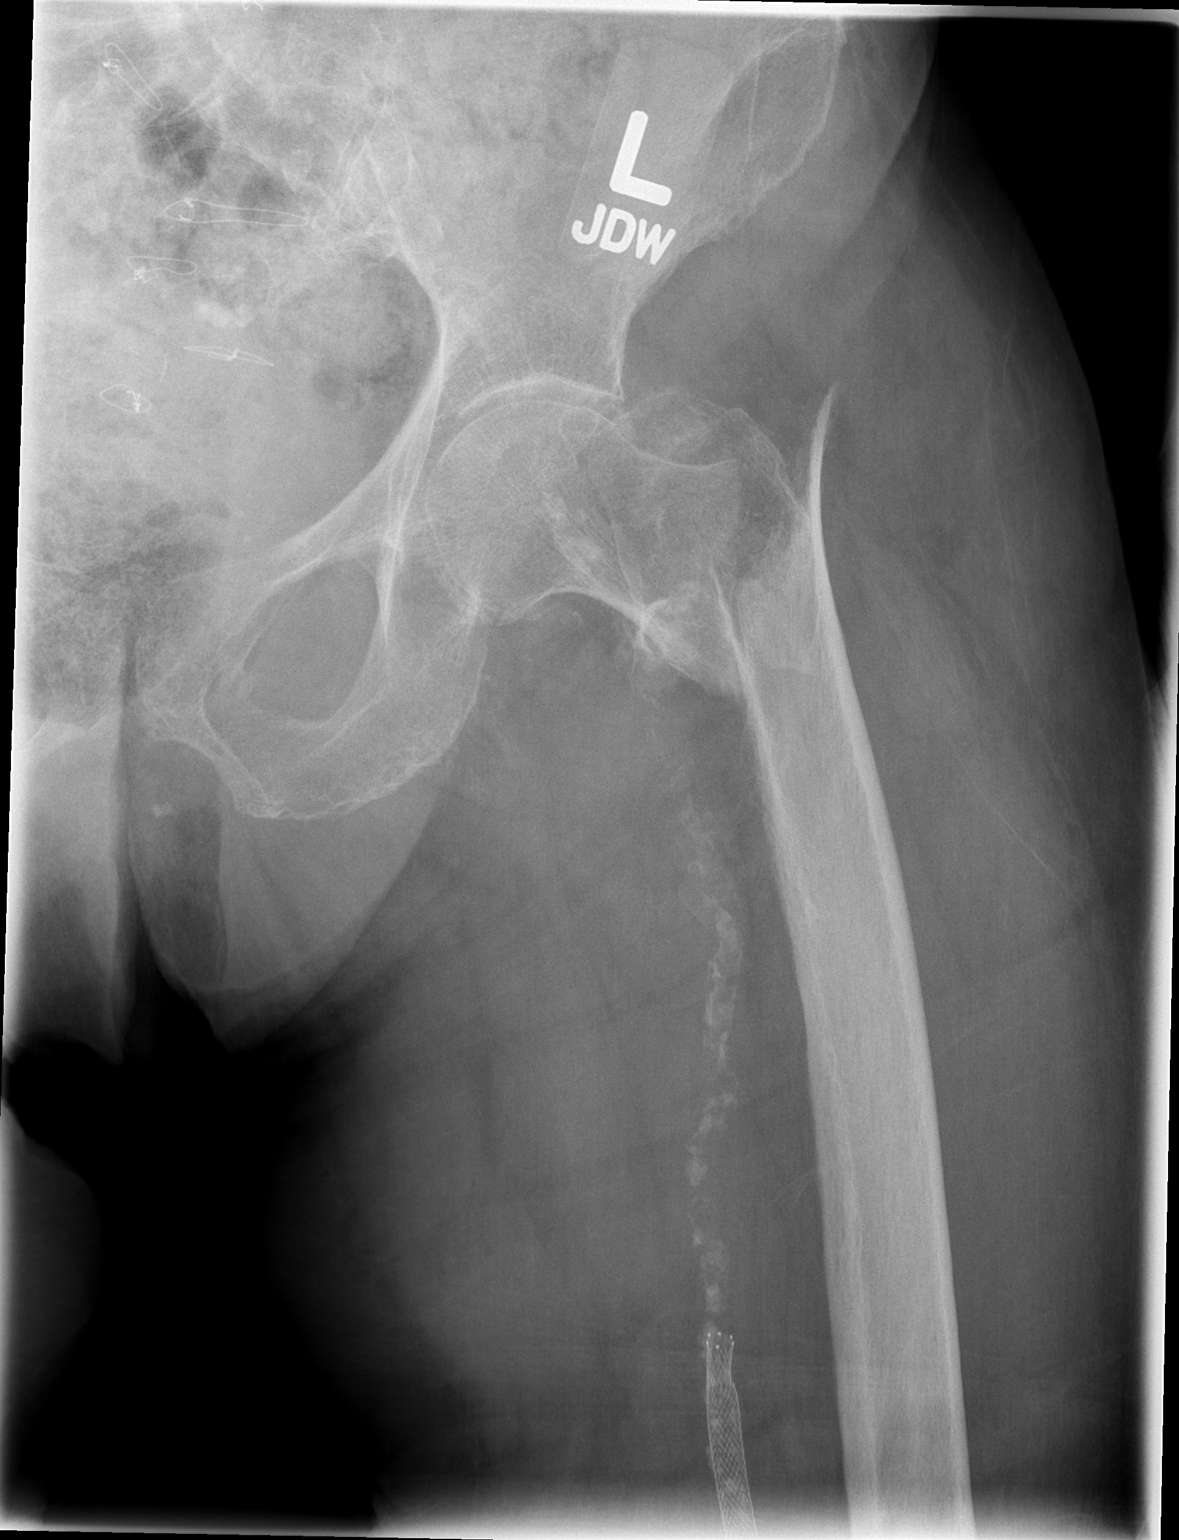

[w cross table hip left *]
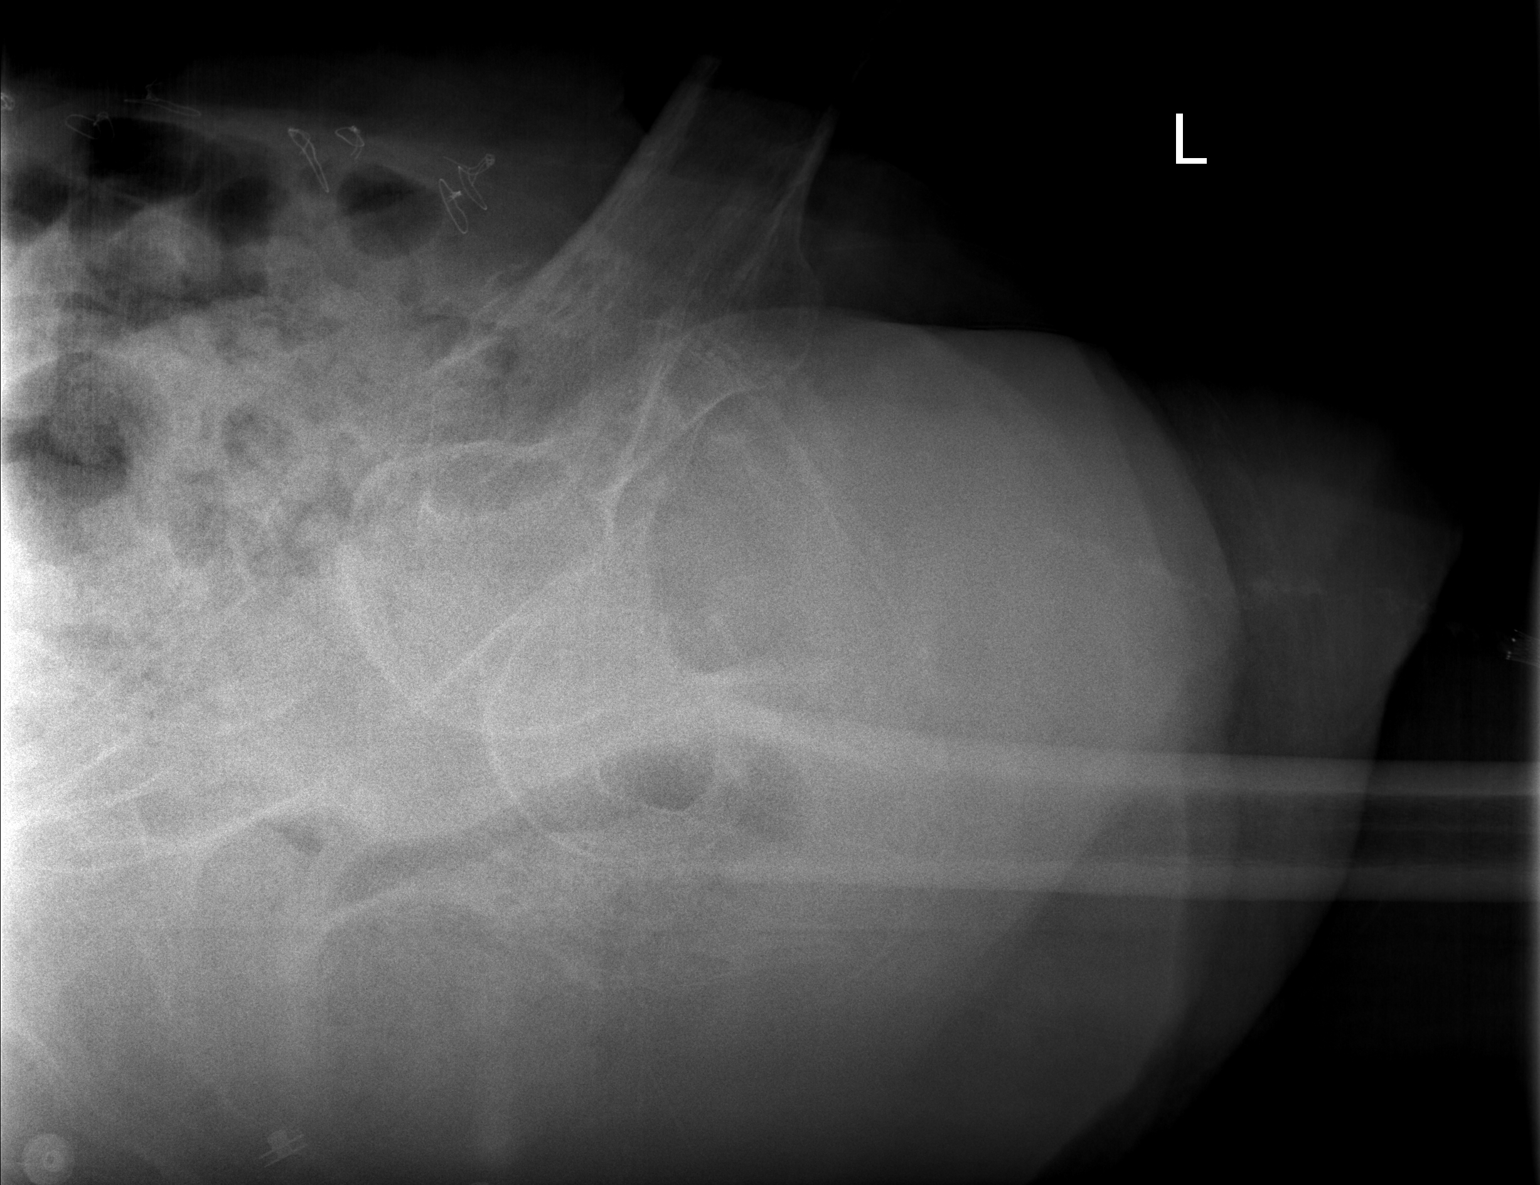

[3 of 3 positions shown; findings below may reference images not displayed]

FINDINGS: There is a comminuted left intertrochanteric fracture with mild
displacement and angulation. There is no left hip dislocation.

There is no right hip fracture or dislocation. There are mild
degenerative changes of the right hip.

There is generalized osteopenia.
IMPRESSION: Comminuted and displaced left intertrochanteric fracture.
